# Patient Record
Sex: Male | Born: 1960 | Race: White | Hispanic: No | Marital: Single | Smoking: Never smoker
Health system: Southern US, Community
[De-identification: ages and names within clinical notes are randomized; demographics above are authoritative.]

---

## 1998-11-05 ENCOUNTER — Encounter: Payer: Self-pay | Admitting: Family Medicine

## 1998-11-05 ENCOUNTER — Ambulatory Visit (HOSPITAL_COMMUNITY): Admission: RE | Admit: 1998-11-05 | Discharge: 1998-11-05 | Payer: Self-pay | Admitting: Family Medicine

## 2005-01-10 ENCOUNTER — Ambulatory Visit (HOSPITAL_COMMUNITY): Admission: RE | Admit: 2005-01-10 | Discharge: 2005-01-10 | Payer: Self-pay | Admitting: Family Medicine

## 2013-02-11 ENCOUNTER — Inpatient Hospital Stay: Admit: 2013-02-11 | Payer: Self-pay | Admitting: Orthopedic Surgery

## 2013-02-11 SURGERY — ARTHROPLASTY, KNEE, TOTAL
Anesthesia: Spinal | Site: Knee | Laterality: Right

## 2013-08-06 ENCOUNTER — Other Ambulatory Visit (HOSPITAL_COMMUNITY): Payer: Self-pay | Admitting: Urology

## 2013-08-06 DIAGNOSIS — N4340 Spermatocele of epididymis, unspecified: Secondary | ICD-10-CM

## 2013-08-13 ENCOUNTER — Ambulatory Visit (HOSPITAL_COMMUNITY)
Admission: RE | Admit: 2013-08-13 | Discharge: 2013-08-13 | Disposition: A | Discharge status: Home / Self Care | Payer: BC Managed Care – PPO | Source: Ambulatory Visit | Attending: Urology | Admitting: Urology

## 2013-08-13 DIAGNOSIS — N4340 Spermatocele of epididymis, unspecified: Secondary | ICD-10-CM

## 2013-08-13 DIAGNOSIS — N509 Disorder of male genital organs, unspecified: Secondary | ICD-10-CM | POA: Insufficient documentation

## 2013-08-13 DIAGNOSIS — N508 Other specified disorders of male genital organs: Secondary | ICD-10-CM | POA: Insufficient documentation

## 2016-01-18 ENCOUNTER — Ambulatory Visit: Admit: 2016-01-18 | Payer: BC Managed Care – PPO | Admitting: Orthopedic Surgery

## 2016-01-18 SURGERY — ARTHROPLASTY, KNEE, TOTAL
Anesthesia: Spinal | Site: Knee | Laterality: Right

## 2016-05-20 ENCOUNTER — Other Ambulatory Visit: Payer: Self-pay | Admitting: Physician Assistant

## 2016-05-20 DIAGNOSIS — M79605 Pain in left leg: Secondary | ICD-10-CM

## 2016-05-27 ENCOUNTER — Other Ambulatory Visit: Payer: BC Managed Care – PPO

## 2016-05-27 ENCOUNTER — Ambulatory Visit
Admission: RE | Admit: 2016-05-27 | Discharge: 2016-05-27 | Disposition: A | Discharge status: Home / Self Care | Payer: BC Managed Care – PPO | Source: Ambulatory Visit | Attending: Physician Assistant | Admitting: Physician Assistant

## 2016-05-27 DIAGNOSIS — M79605 Pain in left leg: Secondary | ICD-10-CM

## 2016-05-30 ENCOUNTER — Other Ambulatory Visit: Payer: BC Managed Care – PPO

## 2017-02-22 ENCOUNTER — Ambulatory Visit
Admission: RE | Admit: 2017-02-22 | Discharge: 2017-02-22 | Disposition: A | Discharge status: Home / Self Care | Payer: Managed Care, Other (non HMO) | Source: Ambulatory Visit | Attending: Family Medicine | Admitting: Family Medicine

## 2017-02-22 ENCOUNTER — Other Ambulatory Visit: Payer: Self-pay | Admitting: Family Medicine

## 2017-02-22 DIAGNOSIS — M25512 Pain in left shoulder: Secondary | ICD-10-CM

## 2018-02-24 DIAGNOSIS — M5136 Other intervertebral disc degeneration, lumbar region: Secondary | ICD-10-CM | POA: Diagnosis not present

## 2018-02-27 DIAGNOSIS — F339 Major depressive disorder, recurrent, unspecified: Secondary | ICD-10-CM | POA: Diagnosis not present

## 2018-03-20 DIAGNOSIS — F4321 Adjustment disorder with depressed mood: Secondary | ICD-10-CM | POA: Diagnosis not present

## 2018-03-21 DIAGNOSIS — D2271 Melanocytic nevi of right lower limb, including hip: Secondary | ICD-10-CM | POA: Diagnosis not present

## 2018-03-21 DIAGNOSIS — D2371 Other benign neoplasm of skin of right lower limb, including hip: Secondary | ICD-10-CM | POA: Diagnosis not present

## 2018-03-21 DIAGNOSIS — D225 Melanocytic nevi of trunk: Secondary | ICD-10-CM | POA: Diagnosis not present

## 2018-03-21 DIAGNOSIS — D2372 Other benign neoplasm of skin of left lower limb, including hip: Secondary | ICD-10-CM | POA: Diagnosis not present

## 2018-03-21 DIAGNOSIS — D485 Neoplasm of uncertain behavior of skin: Secondary | ICD-10-CM | POA: Diagnosis not present

## 2018-03-24 DIAGNOSIS — F329 Major depressive disorder, single episode, unspecified: Secondary | ICD-10-CM | POA: Insufficient documentation

## 2018-03-24 DIAGNOSIS — F32A Depression, unspecified: Secondary | ICD-10-CM

## 2018-04-10 ENCOUNTER — Ambulatory Visit (INDEPENDENT_AMBULATORY_CARE_PROVIDER_SITE_OTHER): Payer: BLUE CROSS/BLUE SHIELD | Admitting: Psychiatry

## 2018-04-10 DIAGNOSIS — M25561 Pain in right knee: Secondary | ICD-10-CM | POA: Diagnosis not present

## 2018-04-10 DIAGNOSIS — E7800 Pure hypercholesterolemia, unspecified: Secondary | ICD-10-CM | POA: Diagnosis not present

## 2018-04-10 DIAGNOSIS — Z23 Encounter for immunization: Secondary | ICD-10-CM | POA: Diagnosis not present

## 2018-04-10 DIAGNOSIS — F331 Major depressive disorder, recurrent, moderate: Secondary | ICD-10-CM

## 2018-04-10 DIAGNOSIS — M545 Low back pain: Secondary | ICD-10-CM | POA: Diagnosis not present

## 2018-04-10 DIAGNOSIS — Z0000 Encounter for general adult medical examination without abnormal findings: Secondary | ICD-10-CM | POA: Diagnosis not present

## 2018-04-10 DIAGNOSIS — J302 Other seasonal allergic rhinitis: Secondary | ICD-10-CM | POA: Diagnosis not present

## 2018-04-10 MED ORDER — DULOXETINE HCL 30 MG PO CPEP: 30.0000 mg | ORAL_CAPSULE | Freq: Every day | ORAL | 0 refills | Status: DC

## 2018-04-10 MED ORDER — DULOXETINE HCL 60 MG PO CPEP: 60.0000 mg | ORAL_CAPSULE | Freq: Every day | ORAL | 1 refills | Status: DC

## 2018-04-10 NOTE — Progress Notes (Signed)
[facility]  Patient ID: Arthur Sanchez,  MRN: [id number]  PCP: Elias Else, MD  Date of Evaluation: 04/10/2018 Time spent:20 minutes   HISTORY/CURRENT STATUS: HPI patient is a 57 year old male last seen 02/27/2018 Depression was better with continued the Effexor and continue tapering off the Zoloft consider Cymbalta or gabapentin in the future  Individual Medical History/ Review of Systems: Changes? :No  Allergies: Patient has no known allergies.  Current Medications:  Current Outpatient Medications:  .  DULoxetine (CYMBALTA) 30 MG capsule, Take 1 capsule (30 mg total) by mouth daily for 7 days., Disp: 7 capsule, Rfl: 0 .  [START ON 04/17/2018] DULoxetine (CYMBALTA) 60 MG capsule, Take 1 capsule (60 mg total) by mouth daily., Disp: 60 capsule, Rfl: 1 .  venlafaxine XR (EFFEXOR-XR) 75 MG 24 hr capsule, Take 75 mg by mouth daily with breakfast. Discontinue in 1 week, Disp: , Rfl:  Medication Side Effects: Other: diarrhea  Family Medical/ Social History: Changes? Yes  Following up on skin lesion   MENTAL HEALTH EXAM:  There were no vitals taken for this visit.There is no height or weight on file to calculate BMI.  General Appearance: Casual  Eye Contact:  Good  Speech:  Normal Rate  Volume:  Normal  Mood:  Euphoric  Affect:  Appropriate  Thought Process:  Linear  Orientation:  Full (Time, Place, and Person)  Thought Content: Logical   Suicidal Thoughts:  No  Homicidal Thoughts:  No  Memory:  Immediate  Judgement:  Good  Insight:  Good  Psychomotor Activity:  Normal  Concentration:  Concentration: Good  Recall:  Good  Fund of Knowledge: Good  Language: Good  Akathisia:  NA  AIMS (if indicated): not done  Assets:  Desire for Improvement  ADL's:  Intact  Cognition: WNL  Prognosis:  Good    DIAGNOSES:    ICD-10-CM   1. Major depressive disorder, recurrent episode, moderate (HCC) F33.1     RECOMMENDATIONS: stop effexor start Cymbalta 30 mg a day for  a week and then 60 mg a day.    Anne Fu, PA-C

## 2018-04-24 DIAGNOSIS — D485 Neoplasm of uncertain behavior of skin: Secondary | ICD-10-CM | POA: Diagnosis not present

## 2018-04-24 DIAGNOSIS — L7682 Other postprocedural complications of skin and subcutaneous tissue: Secondary | ICD-10-CM | POA: Diagnosis not present

## 2018-05-01 DIAGNOSIS — F4321 Adjustment disorder with depressed mood: Secondary | ICD-10-CM | POA: Diagnosis not present

## 2018-05-06 ENCOUNTER — Encounter: Payer: Self-pay | Admitting: Emergency Medicine

## 2018-05-14 ENCOUNTER — Ambulatory Visit (INDEPENDENT_AMBULATORY_CARE_PROVIDER_SITE_OTHER): Payer: BLUE CROSS/BLUE SHIELD | Admitting: Psychiatry

## 2018-05-14 DIAGNOSIS — F324 Major depressive disorder, single episode, in partial remission: Secondary | ICD-10-CM

## 2018-05-14 MED ORDER — DULOXETINE HCL 60 MG PO CPEP: 60.0000 mg | ORAL_CAPSULE | Freq: Every day | ORAL | 1 refills | Status: DC

## 2018-05-14 NOTE — Progress Notes (Signed)
[facility]  Patient ID: Arthur Sanchez,  MRN: [id number]  PCP: Elias Else, MD  Date of Evaluation: 05/14/2018 Time spent:20 minutes  Chief Complaint:   HISTORY/CURRENT STATUS: HPI 57 year old white male last seen 02/27/2018.  His depression was better.  We switched him from Effexor to Cymbalta as he was also having joint pain.  Currently he has a few hours of depression every several weeks.    Individual Medical History/ Review of Systems: Changes? :No   Allergies: Patient has no known allergies.  Current Medications:  Current Outpatient Medications:  .  DULoxetine (CYMBALTA) 60 MG capsule, Take 1 capsule (60 mg total) by mouth daily., Disp: 30 capsule, Rfl: 1 .  sertraline (ZOLOFT) 100 MG tablet, Take 100 mg by mouth daily., Disp: , Rfl:  .  venlafaxine XR (EFFEXOR-XR) 75 MG 24 hr capsule, Take 75 mg by mouth daily with breakfast. Discontinue in 1 week, Disp: , Rfl:  Medication Side Effects: none  Family Medical/ Social History: Changes? No  MENTAL HEALTH EXAM:  There were no vitals taken for this visit.There is no height or weight on file to calculate BMI.  General Appearance: Casual  Eye Contact:  Good  Speech:  Normal Rate  Volume:  Normal  Mood:  Euthymic  Affect:  Appropriate  Thought Process:  Goal Directed  Orientation:  Full (Time, Place, and Person)  Thought Content: Logical   Suicidal Thoughts:  No  Homicidal Thoughts:  No  Memory:  WNL  Judgement:  Good  Insight:  Good  Psychomotor Activity:  Normal  Concentration:  Concentration: Good  Recall:  Good  Fund of Knowledge: Good  Language: Good  Assets:  Desire for Improvement  ADL's:  Intact  Cognition: WNL  Prognosis:  Good    DIAGNOSES:    ICD-10-CM   1. Major depressive disorder with single episode, in partial remission (HCC) F32.4     Receiving Psychotherapy: No    RECOMMENDATIONS: We will make no changes patient is to stay on Cymbalta 60 mg a day and I will see him again  in 6 weeks   Anne Fu, PA-C

## 2018-06-26 ENCOUNTER — Ambulatory Visit: Payer: BLUE CROSS/BLUE SHIELD | Admitting: Psychiatry

## 2018-07-06 ENCOUNTER — Other Ambulatory Visit: Payer: Self-pay

## 2018-07-06 MED ORDER — DULOXETINE HCL 60 MG PO CPEP: 60.0000 mg | ORAL_CAPSULE | Freq: Every day | ORAL | 0 refills | Status: DC

## 2018-07-16 ENCOUNTER — Ambulatory Visit: Payer: BLUE CROSS/BLUE SHIELD | Admitting: Psychiatry

## 2018-07-16 DIAGNOSIS — F324 Major depressive disorder, single episode, in partial remission: Secondary | ICD-10-CM

## 2018-07-16 DIAGNOSIS — F411 Generalized anxiety disorder: Secondary | ICD-10-CM | POA: Diagnosis not present

## 2018-07-16 MED ORDER — DULOXETINE HCL 60 MG PO CPEP: 60.0000 mg | ORAL_CAPSULE | Freq: Every day | ORAL | 0 refills | Status: DC

## 2018-07-16 NOTE — Progress Notes (Signed)
[facility]  Patient ID: Arthur Sanchez,  MRN: [id number]  PCP: Elias Else, MD  Date of Evaluation: 07/16/2018 Time spent:20 minutes  Chief Complaint:   HISTORY/CURRENT STATUS: HPI patient was seen 05/14/2018.  Doing well no medication is made. Patient continues to improve.   Individual Medical History/ Review of Systems: Changes? :No   Allergies: Patient has no known allergies.  Current Medications:  Current Outpatient Medications:  .  DULoxetine (CYMBALTA) 60 MG capsule, Take 1 capsule (60 mg total) by mouth daily., Disp: 90 capsule, Rfl: 0 .  sertraline (ZOLOFT) 100 MG tablet, Take 100 mg by mouth daily., Disp: , Rfl:  .  venlafaxine XR (EFFEXOR-XR) 75 MG 24 hr capsule, Take 75 mg by mouth daily with breakfast. Discontinue in 1 week, Disp: , Rfl:  Medication Side Effects: none  Family Medical/ Social History: Changes? no  MENTAL HEALTH EXAM:  There were no vitals taken for this visit.There is no height or weight on file to calculate BMI.  General Appearance: Casual  Eye Contact:  Good  Speech:  Normal Rate  Volume:  Normal  Mood:  Euthymic  Affect:  Appropriate  Thought Process:  Linear  Orientation:  Full (Time, Place, and Person)  Thought Content: Logical   Suicidal Thoughts:  No  Homicidal Thoughts:  No  Memory:  WNL  Judgement:  Good  Insight:  Good  Psychomotor Activity:  Normal  Concentration:  Concentration: Good  Recall:  Good  Fund of Knowledge: Good  Language: Good  Assets:  Desire for Improvement  ADL's:  Intact  Cognition: WNL  Prognosis:  Good    DIAGNOSES:    ICD-10-CM   1. Major depressive disorder with single episode, in partial remission (HCC) F32.4   2. Anxiety state F41.1     Receiving Psychotherapy: No    RECOMMENDATIONS: Patient is to continue Cymbalta 60 mg a day. Return in 3 months.   Anne Fu, PA-C

## 2018-10-15 ENCOUNTER — Ambulatory Visit: Payer: BLUE CROSS/BLUE SHIELD | Admitting: Psychiatry

## 2018-12-18 ENCOUNTER — Encounter: Payer: Self-pay | Admitting: Psychiatry

## 2018-12-18 ENCOUNTER — Ambulatory Visit (INDEPENDENT_AMBULATORY_CARE_PROVIDER_SITE_OTHER): Payer: 59 | Admitting: Psychiatry

## 2018-12-18 ENCOUNTER — Other Ambulatory Visit: Payer: Self-pay

## 2018-12-18 DIAGNOSIS — F3342 Major depressive disorder, recurrent, in full remission: Secondary | ICD-10-CM | POA: Diagnosis not present

## 2018-12-18 MED ORDER — DULOXETINE HCL 20 MG PO CPEP: 40.0000 mg | ORAL_CAPSULE | Freq: Every day | ORAL | 0 refills | Status: DC

## 2018-12-18 NOTE — Progress Notes (Signed)
Arthur LarsenJohn Sanchez [id number] 11/05/1960 58 y.o.  Subjective:   Patient ID:  Arthur Sanchez is a 58 y.o. (DOB 01/30/1961) male.  Chief Complaint:  Chief Complaint  Patient presents with  . Follow-up    Medication Management  . Depression    Medication Management    HPI Arthur LarsenJohn Bagsby presents to the office today for follow-up of depression and anxiety.  Last seen July 16, 2018.  He was doing well on Effexor XR 75 mg daily.  No meds were changed  Continue to be good except has had diarrhea with each of the antidepressants but least with this.  Still having problems, pretty manageable.  Occ takes Immodium.  Never had this problem until antidepressants.  He's talked with wife about his mood and he thinks he's doing pretty well.  Overall life stress is better now than it is now.  Better professionally and financially.  Less irritable with the kids now.  Anxiety.  Patient reports stable mood and denies depressed or irritable moods.  Patient denies any recent difficulty with anxiety.  Patient denies difficulty with sleep initiation or maintenance. Denies appetite disturbance.  Patient reports that energy and motivation have been good.  Patient denies any difficulty with concentration.  Patient denies any suicidal ideation.  Exercises.  Past Psychiatric Medication Trials: Sertraline, venlafaxine, duloxetine  Review of Systems:  Review of Systems  Musculoskeletal: Positive for arthralgias.  Neurological: Negative for tremors and weakness.    Medications: I have reviewed the patient's current medications.  Current Outpatient Medications  Medication Sig Dispense Refill  . aspirin (ASPIR-LOW) 81 MG EC tablet Take 81 mg by mouth daily. Swallow whole.    . DULoxetine (CYMBALTA) 60 MG capsule Take 1 capsule (60 mg total) by mouth daily. 90 capsule 0  . Loratadine (CLARITIN) 10 MG CAPS Take by mouth.    . Multiple Vitamins-Minerals (MENS MULTIVITAMIN PO) Take by mouth.    . Omega 3 1000 MG CAPS  Take by mouth.    . pravastatin (PRAVACHOL) 40 MG tablet 1 TABLET AT BEDTIME BY MOUTH 90 DAYS    . sildenafil (VIAGRA) 50 MG tablet Take 50 mg by mouth as needed for erectile dysfunction.    . TURMERIC PO Take 1,000 mg by mouth daily.    . valACYclovir (VALTREX) 500 MG tablet Take 500 mg by mouth daily.     No current facility-administered medications for this visit.     Medication Side Effects: Other: diarrhea  Allergies:  Allergies  Allergen Reactions  . Dust Mite Mixed Allergen Ext  [Mite (D. Farinae)] Other (See Comments)  . Molds & Smuts Cough    History reviewed. No pertinent past medical history.  Family History  Problem Relation Age of Onset  . Bipolar disorder Sister   . Alcohol abuse Sister     Social History   Socioeconomic History  . Marital status: Single    Spouse name: Not on file  . Number of children: Not on file  . Years of education: Not on file  . Highest education level: Not on file  Occupational History  . Not on file  Social Needs  . Financial resource strain: Not on file  . Food insecurity:    Worry: Not on file    Inability: Not on file  . Transportation needs:    Medical: Not on file    Non-medical: Not on file  Tobacco Use  . Smoking status: Never Smoker  . Smokeless tobacco: Never Used  Substance and Sexual Activity  .  Alcohol use: Not on file  . Drug use: Not on file  . Sexual activity: Not on file  Lifestyle  . Physical activity:    Days per week: Not on file    Minutes per session: Not on file  . Stress: Not on file  Relationships  . Social connections:    Talks on phone: Not on file    Gets together: Not on file    Attends religious service: Not on file    Active member of club or organization: Not on file    Attends meetings of clubs or organizations: Not on file    Relationship status: Not on file  . Intimate partner violence:    Fear of current or ex partner: Not on file    Emotionally abused: Not on file     Physically abused: Not on file    Forced sexual activity: Not on file  Other Topics Concern  . Not on file  Social History Narrative  . Not on file    Past Medical History, Surgical history, Social history, and Family history were reviewed and updated as appropriate.   Please see review of systems for further details on the patient's review from today.   Objective:   Physical Exam:  There were no vitals taken for this visit.  Physical Exam Constitutional:      General: He is not in acute distress.    Appearance: He is well-developed.  Musculoskeletal:        General: No deformity.  Neurological:     Mental Status: He is alert and oriented to person, place, and time.     Coordination: Coordination normal.  Psychiatric:        Attention and Perception: Attention normal. He is attentive.        Mood and Affect: Mood normal. Mood is not anxious or depressed. Affect is not labile, blunt, angry or inappropriate.        Speech: Speech normal.        Behavior: Behavior normal.        Thought Content: Thought content normal. Thought content does not include homicidal or suicidal ideation. Thought content does not include homicidal or suicidal plan.        Cognition and Memory: Cognition normal.        Judgment: Judgment normal.     Comments: Insight is good.     Lab Review:  No results found for: NA, K, CL, CO2, GLUCOSE, BUN, CREATININE, CALCIUM, PROT, ALBUMIN, AST, ALT, ALKPHOS, BILITOT, GFRNONAA, GFRAA  No results found for: WBC, RBC, HGB, HCT, PLT, MCV, MCH, MCHC, RDW, LYMPHSABS, MONOABS, EOSABS, BASOSABS  No results found for: POCLITH, LITHIUM   No results found for: PHENYTOIN, PHENOBARB, VALPROATE, CBMZ   .res Assessment: Plan:    Major depression, recurrent, full remission (HCC)  Option taper bc stable with first episode.  I have his depression.  At the time of his original episode he was under a lot of great deal of stress which is no longer the case.  No prior  history of significant depression nor treatment for depression.  He has been stable for several months.  He is having some diarrhea with the Cymbalta.  He wonders if it is okay to taper.  Discussed relapse risk and signs and symptoms of depression.  Is quite reasonable to taper the medication.  He is has a low risk of recurrence given his age and the circumstances.  Discussed SSRI withdrawal.  He will decreased  from 60 mg to 40 mg daily for 1 month then 20 mg daily for 1 month and then DC.  Call us if he have any problems.  Follow-up PRN  Hiram Comber, MD, DFAPA   Please see After Visit Summary for patient specific instructions.  No future appointments.  No orders of the defined types were placed in this encounter.     -------------------------------

## 2019-03-12 ENCOUNTER — Other Ambulatory Visit: Payer: Self-pay | Admitting: Psychiatry

## 2019-03-12 DIAGNOSIS — F3342 Major depressive disorder, recurrent, in full remission: Secondary | ICD-10-CM

## 2019-03-19 ENCOUNTER — Other Ambulatory Visit: Payer: Self-pay

## 2019-03-19 DIAGNOSIS — F3342 Major depressive disorder, recurrent, in full remission: Secondary | ICD-10-CM

## 2019-03-19 MED ORDER — DULOXETINE HCL 20 MG PO CPEP: 40.0000 mg | ORAL_CAPSULE | Freq: Every day | ORAL | 0 refills | Status: DC

## 2020-03-04 ENCOUNTER — Encounter: Payer: Self-pay | Admitting: General Practice

## 2020-03-05 ENCOUNTER — Encounter: Payer: Self-pay | Admitting: Family Medicine

## 2020-04-12 NOTE — Progress Notes (Signed)
Cardiology Office Note:    Date:  04/13/2020   ID:  Arthur Sanchez, DOB [birth year], MRN [id number]  PCP:  Maury Dus, MD  Cardiologist:  No primary care provider on file.  Electrophysiologist:  None   Referring MD: Carol Ada, MD   Chief Complaint  Patient presents with  . Chest Pain    History of Present Illness:    Arthur Sanchez is a 59 y.o. male with a hx of hyperlipidemia who is referred by Dr. Tamala Julian for evaluation of chest pain.  He reports that he has had 2 episodes of chest pain.  First episode was 6 weeks ago.  Describes right-sided cramping pain that woke him from sleep.  Reports was 8 out of 10 in intensity.  Lasted for about an hour to an hour and a half.  Reports that about a week later he then had a second episode of what he described as tightness in the right side of his chest.  States that he was sitting at his desk when it occurred.  Lasted for few hours.  States that he exercises 3-4 times per week.  Walks on the treadmill for 15 minutes and does weights.  Denies any exertional chest pain.  Does report he is been having some dyspnea with exertion.  Also reports that he has had chronic issue with ankle swelling, for which he has seen vascular and was told he had venous insufficiency.  He denies any lightheadedness, syncope, or palpitations (reports he had an episode of palpitations years ago for which he wore a monitor and was told there were no abnormalities).  No smoking history.  No history of heart disease in his immediate family.  No past medical history on file.  No past surgical history on file.  Current Medications: Current Meds  Medication Sig  . aspirin (ASPIR-LOW) 81 MG EC tablet Take 81 mg by mouth daily. Swallow whole.  . Loratadine (CLARITIN) 10 MG CAPS Take by mouth.  . Multiple Vitamins-Minerals (MENS MULTIVITAMIN PO) Take by mouth.  . Omega 3 1000 MG CAPS Take by mouth.  . pravastatin (PRAVACHOL) 40 MG tablet 1 TABLET AT BEDTIME BY MOUTH 90 DAYS   . sildenafil (VIAGRA) 50 MG tablet Take 50 mg by mouth as needed for erectile dysfunction.  . TURMERIC PO Take 1,000 mg by mouth daily.  . valACYclovir (VALTREX) 500 MG tablet Take 500 mg by mouth daily.  . [DISCONTINUED] DULoxetine (CYMBALTA) 20 MG capsule Take 2 capsules (40 mg total) by mouth daily.     Allergies:   Dust mite mixed allergen ext  [mite (d. farinae)] and Molds & smuts   Social History   Socioeconomic History  . Marital status: Single    Spouse name: Not on file  . Number of children: Not on file  . Years of education: Not on file  . Highest education level: Not on file  Occupational History  . Not on file  Tobacco Use  . Smoking status: Never Smoker  . Smokeless tobacco: Never Used  Substance and Sexual Activity  . Alcohol use: Not on file  . Drug use: Not on file  . Sexual activity: Not on file  Other Topics Concern  . Not on file  Social History Narrative  . Not on file   Social Determinants of Health   Financial Resource Strain:   . Difficulty of Paying Living Expenses: Not on file  Food Insecurity:   . Worried About Charity fundraiser in the Last Year:  Not on file  . Ran Out of Food in the Last Year: Not on file  Transportation Needs:   . Lack of Transportation (Medical): Not on file  . Lack of Transportation (Non-Medical): Not on file  Physical Activity:   . Days of Exercise per Week: Not on file  . Minutes of Exercise per Session: Not on file  Stress:   . Feeling of Stress : Not on file  Social Connections:   . Frequency of Communication with Friends and Family: Not on file  . Frequency of Social Gatherings with Friends and Family: Not on file  . Attends Religious Services: Not on file  . Active Member of Clubs or Organizations: Not on file  . Attends Archivist Meetings: Not on file  . Marital Status: Not on file     Family History: The patient's family history includes Alcohol abuse in his sister; Bipolar disorder in his  sister.  ROS:   Please see the history of present illness.     All other systems reviewed and are negative.  EKGs/Labs/Other Studies Reviewed:    The following studies were reviewed today:   EKG:  EKG is ordered today.  The ekg ordered today demonstrates normal sinus rhythm, rate 56, no ST/T abnormalities  Recent Labs: 04/13/2020: ALT 28; BUN 15; Creatinine, Ser 0.99; Potassium 4.8; Sodium 138  Recent Lipid Panel No results found for: CHOL, TRIG, HDL, CHOLHDL, VLDL, LDLCALC, LDLDIRECT  Physical Exam:    VS:  BP 115/71   Pulse (!) 56   Temp (!) 97 F (36.1 C)   Ht _0  (1.93 m)   Wt 253 lb (114.8 kg)   SpO2 96%   BMI 30.80 kg/m     Wt Readings from Last 3 Encounters:  04/13/20 253 lb (114.8 kg)     GEN:  Well nourished, well developed in no acute distress HEENT: Normal NECK: No JVD; No carotid bruits LYMPHATICS: No lymphadenopathy CARDIAC: RRR, no murmurs, rubs, gallops RESPIRATORY:  Clear to auscultation without rales, wheezing or rhonchi  ABDOMEN: Soft, non-tender, non-distended MUSCULOSKELETAL:  No edema; No deformity  SKIN: Warm and dry NEUROLOGIC:  Alert and oriented x 3 PSYCHIATRIC:  Normal affect   ASSESSMENT:    1. Chest pain of uncertain etiology   2. Hyperlipidemia, unspecified hyperlipidemia type    PLAN:    Chest pain: Atypical in description, but does have CAD risk factors (age, hyperlipidemia).  Overall would classify as intermediate risk of obstructive coronary disease and warrants further evaluation -Coronary CTA.  Will give metoprolol 25 mg prior to exam given low resting heart rate -Echocardiogram  Hyperlipidemia: LDL 119 on 08/12/2019.  On pravastatin 40 mg daily.  Will follow up results of coronary CTA, will need more aggressive cholesterol control if coronary plaque seen  RTC in 3 months  Medication Adjustments/Labs and Tests Ordered: Current medicines are reviewed at length with the patient today.  Concerns regarding medicines are  outlined above.  Orders Placed This Encounter  Procedures  . CT CORONARY MORPH W/CTA COR W/SCORE W/CA W/CM &/OR WO/CM  . CT CORONARY FRACTIONAL FLOW RESERVE DATA PREP  . CT CORONARY FRACTIONAL FLOW RESERVE FLUID ANALYSIS  . Comprehensive metabolic panel  . EKG 12-Lead  . ECHOCARDIOGRAM COMPLETE   Meds ordered this encounter  Medications  . metoprolol tartrate (LOPRESSOR) 25 MG tablet    Sig: Take 25 mg (1 tablet) TWO hours prior to CT    Dispense:  1 tablet    Refill:  0    Patient Instructions  Medication Instructions:  Your physician recommends that you continue on your current medications as directed. Please refer to the Current Medication list given to you today.  The morning of the coronary CT scan-take metoprolol tartrate (Lopressor) 25 mg TWO hours prior to scan  *If you need a refill on your cardiac medications before your next appointment, please call your pharmacy*  Labs:  CMET today  Testing/Procedures: Your physician has requested that you have an echocardiogram. Echocardiography is a painless test that uses sound waves to create images of your heart. It provides your doctor with information about the size and shape of your heart and how well your heart's chambers and valves are working. This procedure takes approximately one hour. There are no restrictions for this procedure.  This will be done at our [facility] location:  [address]  Coronary CTA- see instruction sheet below  Follow-Up: At [facility], you and your health needs are our priority.  As part of our continuing mission to provide you with exceptional heart care, we have created designated Provider Care Teams.  These Care Teams include your primary Cardiologist (physician) and Advanced Practice Providers (APPs -  Physician Assistants and Nurse Practitioners) who all work together to provide you with the care you need, when you need it.  We recommend signing up for the patient  portal called "MyChart".  Sign up information is provided on this After Visit Summary.  MyChart is used to connect with patients for Virtual Visits (Telemedicine).  Patients are able to view lab/test results, encounter notes, upcoming appointments, etc.  Non-urgent messages can be sent to your provider as well.   To learn more about what you can do with MyChart, go to [email or web address].    Your next appointment:   3 month(s)  The format for your next appointment:   In Person  Provider:   Oswaldo Milian, MD   Other Instructions   Your cardiac CT will be scheduled at one of the below locations:   [facility] [address] [place], [place] [address] [phone]  [place] [address] [place], [place] [address] [phone]  If scheduled at [facility], please arrive at the [facility] main entrance of [facility] 30 minutes prior to test start time. Proceed to the [facility] Radiology Department (first floor) to check-in and test prep.  If scheduled at [facility], please arrive 15 mins early for check-in and test prep.  Please follow these instructions carefully (unless otherwise directed):  Hold all erectile dysfunction medications at least 3 days (72 hrs) prior to test.  On the Night Before the Test: . Be sure to Drink plenty of water. . Do not consume any caffeinated/decaffeinated beverages or chocolate 12 hours prior to your test. . Do not take any antihistamines 12 hours prior to your test.  On the Day of the Test: . Drink plenty of water. Do not drink any water within one hour of the test. . Do not eat any food 4 hours prior to the test. . You may take your regular medications prior to the test.  . Take metoprolol (Lopressor) two hours prior to test.      After the Test: . Drink plenty of water. . After receiving IV contrast, you may  experience a mild flushed feeling. This is normal. . On occasion, you may experience a  mild rash up to 24 hours after the test. This is not dangerous. If this occurs, you can take Benadryl 25 mg and increase your fluid intake. . If you experience trouble breathing, this can be serious. If it is severe call 911 IMMEDIATELY. If it is mild, please call our office. . If you take any of these medications: Glipizide/Metformin, Avandament, Glucavance, please do not take 48 hours after completing test unless otherwise instructed.   Once we have confirmed authorization from your insurance company, we will call you to set up a date and time for your test. Based on how quickly your insurance processes prior authorizations requests, please allow up to 4 weeks to be contacted for scheduling your Cardiac CT appointment. Be advised that routine Cardiac CT appointments could be scheduled as many as 8 weeks after your provider has ordered it.  For non-scheduling related questions, please contact the cardiac imaging nurse navigator should you have any questions/concerns: Marchia Bond, Cardiac Imaging Nurse Navigator Burley Saver, Interim Cardiac Imaging Nurse [facility] [facility] Direct Office Dial: [phone]   For scheduling needs, including cancellations and rescheduling, please call Vivien Rota at [phone], option 3.        Signed, Donato Heinz, MD  04/13/2020 5:43 PM    [place]

## 2020-04-13 ENCOUNTER — Ambulatory Visit (INDEPENDENT_AMBULATORY_CARE_PROVIDER_SITE_OTHER): Payer: Managed Care, Other (non HMO) | Admitting: Cardiology

## 2020-04-13 ENCOUNTER — Other Ambulatory Visit: Payer: Self-pay

## 2020-04-13 ENCOUNTER — Encounter: Payer: Self-pay | Admitting: Cardiology

## 2020-04-13 VITALS — BP 115/71 | HR 56 | Temp 97.0°F | Ht 76.0 in | Wt 253.0 lb

## 2020-04-13 DIAGNOSIS — E785 Hyperlipidemia, unspecified: Secondary | ICD-10-CM

## 2020-04-13 DIAGNOSIS — R079 Chest pain, unspecified: Secondary | ICD-10-CM

## 2020-04-13 LAB — COMPREHENSIVE METABOLIC PANEL
ALT: 28 IU/L (ref 0–44)
AST: 28 IU/L (ref 0–40)
Albumin/Globulin Ratio: 2.1 (ref 1.2–2.2)
Albumin: 4.6 g/dL (ref 3.8–4.9)
Alkaline Phosphatase: 64 IU/L (ref 44–121)
BUN/Creatinine Ratio: 15 (ref 9–20)
BUN: 15 mg/dL (ref 6–24)
Bilirubin Total: 0.5 mg/dL (ref 0.0–1.2)
CO2: 25 mmol/L (ref 20–29)
Calcium: 9.7 mg/dL (ref 8.7–10.2)
Chloride: 100 mmol/L (ref 96–106)
Creatinine, Ser: 0.99 mg/dL (ref 0.76–1.27)
GFR calc Af Amer: 96 mL/min/{1.73_m2} (ref >59)
GFR calc non Af Amer: 83 mL/min/{1.73_m2} (ref >59)
Globulin, Total: 2.2 g/dL (ref 1.5–4.5)
Glucose: 97 mg/dL (ref 65–99)
Potassium: 4.8 mmol/L (ref 3.5–5.2)
Sodium: 138 mmol/L (ref 134–144)
Total Protein: 6.8 g/dL (ref 6.0–8.5)

## 2020-04-13 MED ORDER — METOPROLOL TARTRATE 25 MG PO TABS: ORAL_TABLET | ORAL | 0 refills | Status: DC

## 2020-04-13 NOTE — Patient Instructions (Signed)
Medication Instructions:  Your physician recommends that you continue on your current medications as directed. Please refer to the Current Medication list given to you today.  The morning of the coronary CT scan-take metoprolol tartrate (Lopressor) 25 mg TWO hours prior to scan  *If you need a refill on your cardiac medications before your next appointment, please call your pharmacy*  Labs:  CMET today  Testing/Procedures: Your physician has requested that you have an echocardiogram. Echocardiography is a painless test that uses sound waves to create images of your heart. It provides your doctor with information about the size and shape of your heart and how well your heart's chambers and valves are working. This procedure takes approximately one hour. There are no restrictions for this procedure.  This will be done at our [facility] location:  [address]  Coronary CTA- see instruction sheet below  Follow-Up: At [facility], you and your health needs are our priority.  As part of our continuing mission to provide you with exceptional heart care, we have created designated Provider Care Teams.  These Care Teams include your primary Cardiologist (physician) and Advanced Practice Providers (APPs -  Physician Assistants and Nurse Practitioners) who all work together to provide you with the care you need, when you need it.  We recommend signing up for the patient portal called "MyChart".  Sign up information is provided on this After Visit Summary.  MyChart is used to connect with patients for Virtual Visits (Telemedicine).  Patients are able to view lab/test results, encounter notes, upcoming appointments, etc.  Non-urgent messages can be sent to your provider as well.   To learn more about what you can do with MyChart, go to [email or web address].    Your next appointment:   3 month(s)  The format for your next appointment:   In Person  Provider:   Oswaldo Milian, MD   Other Instructions   Your cardiac CT will be scheduled at one of the below locations:   [facility] [address] [place], [place] [address] [phone]  [facility] [address] [place], [place] [address] [phone]  If scheduled at [facility], please arrive at the [facility] main entrance of [facility] 30 minutes prior to test start time. Proceed to the [facility] Radiology Department (first floor) to check-in and test prep.  If scheduled at [facility], please arrive 15 mins early for check-in and test prep.  Please follow these instructions carefully (unless otherwise directed):  Hold all erectile dysfunction medications at least 3 days (72 hrs) prior to test.  On the Night Before the Test: . Be sure to Drink plenty of water. . Do not consume any caffeinated/decaffeinated beverages or chocolate 12 hours prior to your test. . Do not take any antihistamines 12 hours prior to your test.  On the Day of the Test: . Drink plenty of water. Do not drink any water within one hour of the test. . Do not eat any food 4 hours prior to the test. . You may take your regular medications prior to the test.  . Take metoprolol (Lopressor) two hours prior to test.      After the Test: . Drink plenty of water. . After receiving IV contrast, you may experience a mild flushed feeling. This is normal. . On occasion, you may experience a mild rash up to 24 hours after the  test. This is not dangerous. If this occurs, you can take Benadryl 25 mg and increase your fluid intake. . If you experience trouble breathing, this can be serious. If it is severe call 911 IMMEDIATELY. If it is mild, please call our office. . If you take any of these medications: Glipizide/Metformin, Avandament, Glucavance, please do not take 48 hours after completing test unless otherwise  instructed.   Once we have confirmed authorization from your insurance company, we will call you to set up a date and time for your test. Based on how quickly your insurance processes prior authorizations requests, please allow up to 4 weeks to be contacted for scheduling your Cardiac CT appointment. Be advised that routine Cardiac CT appointments could be scheduled as many as 8 weeks after your provider has ordered it.  For non-scheduling related questions, please contact the cardiac imaging nurse navigator should you have any questions/concerns: Marchia Bond, Cardiac Imaging Nurse Navigator Burley Saver, Interim Cardiac Imaging Nurse [facility] Direct Office Dial: [phone]   For scheduling needs, including cancellations and rescheduling, please call Vivien Rota at [phone], option 3.

## 2020-04-30 ENCOUNTER — Ambulatory Visit (HOSPITAL_COMMUNITY): Payer: Managed Care, Other (non HMO) | Attending: Cardiology

## 2020-04-30 ENCOUNTER — Other Ambulatory Visit: Payer: Self-pay

## 2020-04-30 DIAGNOSIS — R079 Chest pain, unspecified: Secondary | ICD-10-CM | POA: Diagnosis present

## 2020-04-30 LAB — ECHOCARDIOGRAM COMPLETE
Area-P 1/2: 3.48 cm2
S' Lateral: 3.3 cm

## 2020-05-06 ENCOUNTER — Telehealth (HOSPITAL_COMMUNITY): Payer: Self-pay | Admitting: Emergency Medicine

## 2020-05-06 NOTE — Telephone Encounter (Signed)
Attempted to call patient regarding upcoming cardiac CT appointment. °Left message on voicemail with name and callback number °Syriana Croslin RN Navigator Cardiac Imaging °[facility] °[phone] Office °[phone] Cell ° °

## 2020-05-06 NOTE — Telephone Encounter (Signed)
Reaching out to patient to offer assistance regarding upcoming cardiac imaging study; pt verbalizes understanding of appt date/time, parking situation and where to check in, pre-test NPO status and medications ordered, and verified current allergies; name and call back number provided for further questions should they arise Rockwell Alexandria RN Navigator Cardiac Imaging [facility] [phone] office [phone] cell  Pt verbalized understanding to avoid use of viagra. Will take 25mg  metoprolol 2 hr prior to scan. Denies further questions. 

## 2020-05-07 ENCOUNTER — Encounter: Payer: Self-pay | Admitting: *Deleted

## 2020-05-07 ENCOUNTER — Other Ambulatory Visit: Payer: Self-pay

## 2020-05-07 ENCOUNTER — Ambulatory Visit (HOSPITAL_COMMUNITY)
Admission: RE | Admit: 2020-05-07 | Discharge: 2020-05-07 | Disposition: A | Discharge status: Home / Self Care | Payer: Managed Care, Other (non HMO) | Source: Ambulatory Visit | Attending: Cardiology | Admitting: Cardiology

## 2020-05-07 DIAGNOSIS — R079 Chest pain, unspecified: Secondary | ICD-10-CM

## 2020-05-07 DIAGNOSIS — Z006 Encounter for examination for normal comparison and control in clinical research program: Secondary | ICD-10-CM

## 2020-05-07 MED ORDER — IOHEXOL 350 MG/ML SOLN
80.0000 mL | Freq: Once | INTRAVENOUS | Status: AC | PRN
  Administered 2020-05-07: 80 mL via INTRAVENOUS

## 2020-05-07 MED ORDER — NITROGLYCERIN 0.4 MG SL SUBL
SUBLINGUAL_TABLET | SUBLINGUAL | Status: AC
  Filled 2020-05-07: qty 2

## 2020-05-07 MED ORDER — NITROGLYCERIN 0.4 MG SL SUBL
0.8000 mg | SUBLINGUAL_TABLET | Freq: Once | SUBLINGUAL | Status: AC
  Administered 2020-05-07: 0.8 mg via SUBLINGUAL

## 2020-05-07 NOTE — Research (Signed)
CADFEM Informed Consent                  Subject Name:   Arthur Sanchez   Subject met inclusion and exclusion criteria.  The informed consent form, study requirements and expectations were reviewed with the subject and questions and concerns were addressed prior to the signing of the consent form.  The subject verbalized understanding of the trial requirements.  The subject agreed to participate in the CADFEM trial and signed the informed consent.  The informed consent was obtained prior to performance of any protocol-specific procedures for the subject.  A copy of the signed informed consent was given to the subject and a copy was placed in the subject's medical record.   [place] Celeste Tavenner, Research Assistant  05/07/2020 14:30 p.m.

## 2020-05-08 ENCOUNTER — Other Ambulatory Visit: Payer: Self-pay | Admitting: *Deleted

## 2020-05-08 DIAGNOSIS — IMO0001 Reserved for inherently not codable concepts without codable children: Secondary | ICD-10-CM

## 2020-05-08 DIAGNOSIS — R911 Solitary pulmonary nodule: Secondary | ICD-10-CM

## 2020-07-12 NOTE — Progress Notes (Deleted)
Cardiology Office Note:    Date:  07/12/2020   ID:  Arthur Sanchez, DOB 05/23/1961, MRN [id number]  PCP:  Elias Else, MD  Cardiologist:  No primary care provider on file.  Electrophysiologist:  None   Referring MD: Elias Else, MD   No chief complaint on file.   History of Present Illness:    Arthur Sanchez is a 60 y.o. male with a hx of hyperlipidemia who presents for follow-up.  He was referred by Dr. Katrinka Blazing for evaluation of chest pain, initially seen on 04/13/2020.  He reports that he has had 2 episodes of chest pain.  First episode was 6 weeks ago.  Describes right-sided cramping pain that woke him from sleep.  Reports was 8 out of 10 in intensity.  Lasted for about an hour to an hour and a half.  Reports that about a week later he then had a second episode of what he described as tightness in the right side of his chest.  States that he was sitting at his desk when it occurred.  Lasted for few hours.  States that he exercises 3-4 times per week.  Walks on the treadmill for 15 minutes and does weights.  Denies any exertional chest pain.  Does report he is been having some dyspnea with exertion.  Also reports that he has had chronic issue with ankle swelling, for which he has seen vascular and was told he had venous insufficiency.  He denies any lightheadedness, syncope, or palpitations (reports he had an episode of palpitations years ago for which he wore a monitor and was told there were no abnormalities).  No smoking history.  No history of heart disease in his immediate family.  Echocardiogram on 04/30/2020 showed normal biventricular function, no significant valvular disease.  Coronary CTA on 05/07/2020 showed normal coronary arteries, calcium score 0 (though inferior portion of the heart was not imaged to distal RPDA was not seen).  No past medical history on file.  No past surgical history on file.  Current Medications: No outpatient medications have been marked as taking for the  07/15/20 encounter (Appointment) with Little Ishikawa, MD.     Allergies:   Dust mite mixed allergen ext  [mite (d. farinae)] and Molds & smuts   Social History   Socioeconomic History  . Marital status: Single    Spouse name: Not on file  . Number of children: Not on file  . Years of education: Not on file  . Highest education level: Not on file  Occupational History  . Not on file  Tobacco Use  . Smoking status: Never Smoker  . Smokeless tobacco: Never Used  Substance and Sexual Activity  . Alcohol use: Not on file  . Drug use: Not on file  . Sexual activity: Not on file  Other Topics Concern  . Not on file  Social History Narrative  . Not on file   Social Determinants of Health   Financial Resource Strain: Not on file  Food Insecurity: Not on file  Transportation Needs: Not on file  Physical Activity: Not on file  Stress: Not on file  Social Connections: Not on file     Family History: The patient's family history includes Alcohol abuse in his sister; Bipolar disorder in his sister.  ROS:   Please see the history of present illness.     All other systems reviewed and are negative.  EKGs/Labs/Other Studies Reviewed:    The following studies were reviewed today:  EKG:  EKG is ordered today.  The ekg ordered today demonstrates normal sinus rhythm, rate 56, no ST/T abnormalities  Recent Labs: 04/13/2020: ALT 28; BUN 15; Creatinine, Ser 0.99; Potassium 4.8; Sodium 138  Recent Lipid Panel No results found for: CHOL, TRIG, HDL, CHOLHDL, VLDL, LDLCALC, LDLDIRECT  Physical Exam:    VS:  There were no vitals taken for this visit.    Wt Readings from Last 3 Encounters:  04/13/20 253 lb (114.8 kg)     GEN:  Well nourished, well developed in no acute distress HEENT: Normal NECK: No JVD; No carotid bruits LYMPHATICS: No lymphadenopathy CARDIAC: RRR, no murmurs, rubs, gallops RESPIRATORY:  Clear to auscultation without rales, wheezing or rhonchi   ABDOMEN: Soft, non-tender, non-distended MUSCULOSKELETAL:  No edema; No deformity  SKIN: Warm and dry NEUROLOGIC:  Alert and oriented x 3 PSYCHIATRIC:  Normal affect   ASSESSMENT:    No diagnosis found. PLAN:    Chest pain: Atypical in description, but does have CAD risk factors (age, hyperlipidemia).   Echocardiogram on 04/30/2020 showed normal biventricular function, no significant valvular disease.  Coronary CTA on 05/07/2020 showed normal coronary arteries, calcium score 0 (though inferior portion of the heart was not imaged to distal RPDA was not seen).  Hyperlipidemia: LDL 119 on 08/12/2019.  On pravastatin 40 mg daily.    RTC in ***  Medication Adjustments/Labs and Tests Ordered: Current medicines are reviewed at length with the patient today.  Concerns regarding medicines are outlined above.  No orders of the defined types were placed in this encounter.  No orders of the defined types were placed in this encounter.   There are no Patient Instructions on file for this visit.   Signed, Little Ishikawa, MD  07/12/2020 9:05 PM    [facility]

## 2020-07-15 ENCOUNTER — Ambulatory Visit: Payer: Managed Care, Other (non HMO) | Admitting: Cardiology

## 2020-07-15 NOTE — Progress Notes (Signed)
Cardiology Office Note:    Date:  07/16/2020   ID:  Arthur Sanchez, DOB [birth year], MRN [id number]  PCP:  Elias Else, MD  Cardiologist:  No primary care provider on file.  Electrophysiologist:  None   Referring MD: Elias Else, MD   Chief Complaint  Patient presents with  . Chest Pain    History of Present Illness:    Arthur Sanchez is a 60 y.o. male with a hx of hyperlipidemia who presents for follow-up.  He was referred by Dr. Katrinka Blazing for evaluation of chest pain, initially seen on 04/13/2020.  He reports that he has had 2 episodes of chest pain.  First episode was 6 weeks ago.  Describes right-sided cramping pain that woke him from sleep.  Reports was 8 out of 10 in intensity.  Lasted for about an hour to an hour and a half.  Reports that about a week later he then had a second episode of what he described as tightness in the right side of his chest.  States that he was sitting at his desk when it occurred.  Lasted for few hours.  States that he exercises 3-4 times per week.  Walks on the treadmill for 15 minutes and does weights.  Denies any exertional chest pain.  Does report he is been having some dyspnea with exertion.  Also reports that he has had chronic issue with ankle swelling, for which he has seen vascular and was told he had venous insufficiency.  He denies any lightheadedness, syncope, or palpitations (reports he had an episode of palpitations years ago for which he wore a monitor and was told there were no abnormalities).  No smoking history.  No history of heart disease in his immediate family.  Echocardiogram on 04/30/2020 showed normal biventricular function, no significant valvular disease.  Coronary CTA on 05/07/2020 showed normal coronary arteries, calcium score 0 (though inferior portion of the heart was not imaged to distal RPDA was not seen).  Since last clinic visit, he reports that he has been doing well.  Denies any chest pain, dyspnea, lightheadedness, syncope, or  palpitations. Mild ankle swelling.     No past medical history on file.  No past surgical history on file.  Current Medications: Current Meds  Medication Sig  . aspirin 81 MG EC tablet Take 81 mg by mouth daily. Swallow whole.  . Loratadine 10 MG CAPS Take by mouth.  . metoprolol tartrate (LOPRESSOR) 25 MG tablet Take 25 mg (1 tablet) TWO hours prior to CT  . Multiple Vitamins-Minerals (MENS MULTIVITAMIN PO) Take by mouth.  . Omega 3 1000 MG CAPS Take by mouth.  . pravastatin (PRAVACHOL) 40 MG tablet 1 TABLET AT BEDTIME BY MOUTH 90 DAYS  . sildenafil (VIAGRA) 50 MG tablet Take 50 mg by mouth as needed for erectile dysfunction.  . TURMERIC PO Take 1,000 mg by mouth daily.  . valACYclovir (VALTREX) 500 MG tablet Take 500 mg by mouth daily.     Allergies:   Dust mite mixed allergen ext  [mite (d. farinae)] and Molds & smuts   Social History   Socioeconomic History  . Marital status: Single    Spouse name: Not on file  . Number of children: Not on file  . Years of education: Not on file  . Highest education level: Not on file  Occupational History  . Not on file  Tobacco Use  . Smoking status: Never Smoker  . Smokeless tobacco: Never Used  Substance and Sexual Activity  .  Alcohol use: Not on file  . Drug use: Not on file  . Sexual activity: Not on file  Other Topics Concern  . Not on file  Social History Narrative  . Not on file   Social Determinants of Health   Financial Resource Strain: Not on file  Food Insecurity: Not on file  Transportation Needs: Not on file  Physical Activity: Not on file  Stress: Not on file  Social Connections: Not on file     Family History: The patient's family history includes Alcohol abuse in his sister; Bipolar disorder in his sister.  ROS:   Please see the history of present illness.     All other systems reviewed and are negative.  EKGs/Labs/Other Studies Reviewed:    The following studies were reviewed today:   EKG:  EKG  is ordered today.  The ekg ordered today demonstrates normal sinus rhythm, rate 56, no ST/T abnormalities  Recent Labs: 04/13/2020: ALT 28; BUN 15; Creatinine, Ser 0.99; Potassium 4.8; Sodium 138  Recent Lipid Panel No results found for: CHOL, TRIG, HDL, CHOLHDL, VLDL, LDLCALC, LDLDIRECT  Physical Exam:    VS:  Ht 6' 3.5" (1.918 m)   Wt 250 lb 9.6 oz (113.7 kg)   SpO2 94%   BMI 30.91 kg/m     Wt Readings from Last 3 Encounters:  07/16/20 250 lb 9.6 oz (113.7 kg)  04/13/20 253 lb (114.8 kg)     GEN:  Well nourished, well developed in no acute distress HEENT: Normal NECK: No JVD; No carotid bruits LYMPHATICS: No lymphadenopathy CARDIAC: RRR, no murmurs, rubs, gallops RESPIRATORY:  Clear to auscultation without rales, wheezing or rhonchi  ABDOMEN: Soft, non-tender, non-distended MUSCULOSKELETAL:  No edema; No deformity  SKIN: Warm and dry NEUROLOGIC:  Alert and oriented x 3 PSYCHIATRIC:  Normal affect   ASSESSMENT:    1. Chest pain of uncertain etiology   2. Lung nodule < 6cm on CT   3. Hyperlipidemia, unspecified hyperlipidemia type    PLAN:    Chest pain: Atypical in description, but does have CAD risk factors (age, hyperlipidemia).   Echocardiogram on 04/30/2020 showed normal biventricular function, no significant valvular disease.  Coronary CTA on 05/07/2020 showed normal coronary arteries, calcium score 0 (though inferior portion of the heart was not imaged to distal RPDA was not seen).  Hyperlipidemia: LDL 119 on 08/12/2019.  On pravastatin 40 mg daily.    Pulmonary nodule: Coronary CTA showed right middle lobe nodule measuring 1.4 x 1.3 x 1.5 cm.  Repeat chest CT in 3 months recommended.  We will follow-up chest CT  RTC as needed  Medication Adjustments/Labs and Tests Ordered: Current medicines are reviewed at length with the patient today.  Concerns regarding medicines are outlined above.  No orders of the defined types were placed in this encounter.  No orders  of the defined types were placed in this encounter.   Patient Instructions  Medication Instructions:  Your physician recommends that you continue on your current medications as directed. Please refer to the Current Medication list given to you today.  Testing/Procedures: CT Chest due End of January  Follow-Up: At [facility], you and your health needs are our priority.  As part of our continuing mission to provide you with exceptional heart care, we have created designated Provider Care Teams.  These Care Teams include your primary Cardiologist (physician) and Advanced Practice Providers (APPs -  Physician Assistants and Nurse Practitioners) who all work together to provide you with the  care you need, when you need it.  We recommend signing up for the patient portal called "MyChart".  Sign up information is provided on this After Visit Summary.  MyChart is used to connect with patients for Virtual Visits (Telemedicine).  Patients are able to view lab/test results, encounter notes, upcoming appointments, etc.  Non-urgent messages can be sent to your provider as well.   To learn more about what you can do with MyChart, go to [email or web address].    Your next appointment:   AS NEEDED with Dr. Gardiner Rhyme      Signed, Donato Heinz, MD  07/16/2020 9:11 AM    [place]

## 2020-07-16 ENCOUNTER — Encounter: Payer: Self-pay | Admitting: Cardiology

## 2020-07-16 ENCOUNTER — Other Ambulatory Visit: Payer: Self-pay

## 2020-07-16 ENCOUNTER — Ambulatory Visit: Payer: Managed Care, Other (non HMO) | Admitting: Cardiology

## 2020-07-16 VITALS — Ht 75.5 in | Wt 250.6 lb

## 2020-07-16 DIAGNOSIS — R079 Chest pain, unspecified: Secondary | ICD-10-CM

## 2020-07-16 DIAGNOSIS — E785 Hyperlipidemia, unspecified: Secondary | ICD-10-CM

## 2020-07-16 DIAGNOSIS — R911 Solitary pulmonary nodule: Secondary | ICD-10-CM

## 2020-07-16 DIAGNOSIS — IMO0001 Reserved for inherently not codable concepts without codable children: Secondary | ICD-10-CM

## 2020-07-16 NOTE — Patient Instructions (Signed)
Medication Instructions:  Your physician recommends that you continue on your current medications as directed. Please refer to the Current Medication list given to you today.  Testing/Procedures: CT Chest due End of January  Follow-Up: At [facility], you and your health needs are our priority.  As part of our continuing mission to provide you with exceptional heart care, we have created designated Provider Care Teams.  These Care Teams include your primary Cardiologist (physician) and Advanced Practice Providers (APPs -  Physician Assistants and Nurse Practitioners) who all work together to provide you with the care you need, when you need it.  We recommend signing up for the patient portal called "MyChart".  Sign up information is provided on this After Visit Summary.  MyChart is used to connect with patients for Virtual Visits (Telemedicine).  Patients are able to view lab/test results, encounter notes, upcoming appointments, etc.  Non-urgent messages can be sent to your provider as well.   To learn more about what you can do with MyChart, go to [email or web address].    Your next appointment:   AS NEEDED with Dr. Bjorn Pippin

## 2020-08-10 ENCOUNTER — Other Ambulatory Visit: Payer: Self-pay

## 2020-08-10 ENCOUNTER — Ambulatory Visit (INDEPENDENT_AMBULATORY_CARE_PROVIDER_SITE_OTHER)
Admission: RE | Admit: 2020-08-10 | Discharge: 2020-08-10 | Disposition: A | Discharge status: Home / Self Care | Payer: Managed Care, Other (non HMO) | Source: Ambulatory Visit | Attending: Cardiology | Admitting: Cardiology

## 2020-08-10 DIAGNOSIS — R911 Solitary pulmonary nodule: Secondary | ICD-10-CM | POA: Diagnosis not present

## 2020-08-10 DIAGNOSIS — IMO0001 Reserved for inherently not codable concepts without codable children: Secondary | ICD-10-CM

## 2020-08-13 ENCOUNTER — Telehealth: Payer: Self-pay | Admitting: *Deleted

## 2020-08-13 NOTE — Telephone Encounter (Signed)
I called patient for 90 day phone for CAD-Fem study. I asked patient to call me back.

## 2021-07-15 DIAGNOSIS — M5416 Radiculopathy, lumbar region: Secondary | ICD-10-CM | POA: Diagnosis not present

## 2021-07-15 DIAGNOSIS — Z01818 Encounter for other preprocedural examination: Secondary | ICD-10-CM | POA: Diagnosis not present

## 2021-07-16 DIAGNOSIS — M5416 Radiculopathy, lumbar region: Secondary | ICD-10-CM | POA: Diagnosis not present

## 2021-07-21 DIAGNOSIS — M48061 Spinal stenosis, lumbar region without neurogenic claudication: Secondary | ICD-10-CM | POA: Diagnosis not present

## 2021-07-21 DIAGNOSIS — M4316 Spondylolisthesis, lumbar region: Secondary | ICD-10-CM | POA: Diagnosis not present

## 2021-07-21 DIAGNOSIS — M5126 Other intervertebral disc displacement, lumbar region: Secondary | ICD-10-CM | POA: Diagnosis not present

## 2021-07-21 DIAGNOSIS — M5416 Radiculopathy, lumbar region: Secondary | ICD-10-CM | POA: Diagnosis not present

## 2021-08-31 ENCOUNTER — Emergency Department (HOSPITAL_COMMUNITY): Payer: BC Managed Care – PPO

## 2021-08-31 ENCOUNTER — Emergency Department (HOSPITAL_COMMUNITY)
Admission: EM | Admit: 2021-08-31 | Discharge: 2021-08-31 | Disposition: A | Discharge status: Home / Self Care | Payer: BC Managed Care – PPO | Attending: Emergency Medicine | Admitting: Emergency Medicine

## 2021-08-31 ENCOUNTER — Encounter (HOSPITAL_COMMUNITY): Payer: Self-pay

## 2021-08-31 ENCOUNTER — Other Ambulatory Visit: Payer: Self-pay

## 2021-08-31 DIAGNOSIS — M5416 Radiculopathy, lumbar region: Secondary | ICD-10-CM | POA: Diagnosis not present

## 2021-08-31 DIAGNOSIS — Z20822 Contact with and (suspected) exposure to covid-19: Secondary | ICD-10-CM | POA: Insufficient documentation

## 2021-08-31 DIAGNOSIS — R55 Syncope and collapse: Secondary | ICD-10-CM | POA: Diagnosis not present

## 2021-08-31 LAB — CBC
HCT: 35.5 % — ABNORMAL LOW (ref 39.0–52.0)
Hemoglobin: 12.5 g/dL — ABNORMAL LOW (ref 13.0–17.0)
MCH: 34 pg (ref 26.0–34.0)
MCHC: 35.2 g/dL (ref 30.0–36.0)
MCV: 96.5 fL (ref 80.0–100.0)
Platelets: 220 10*3/uL (ref 150–400)
RBC: 3.68 MIL/uL — ABNORMAL LOW (ref 4.22–5.81)
RDW: 13 % (ref 11.5–15.5)
WBC: 5.3 10*3/uL (ref 4.0–10.5)
nRBC: 0 % (ref 0.0–0.2)

## 2021-08-31 LAB — TROPONIN I (HIGH SENSITIVITY): Troponin I (High Sensitivity): <2 ng/L (ref <18)

## 2021-08-31 LAB — BASIC METABOLIC PANEL
Anion gap: 6 (ref 5–15)
BUN: 15 mg/dL (ref 6–20)
CO2: 24 mmol/L (ref 22–32)
Calcium: 7.9 mg/dL — ABNORMAL LOW (ref 8.9–10.3)
Chloride: 106 mmol/L (ref 98–111)
Creatinine, Ser: 1.1 mg/dL (ref 0.61–1.24)
GFR, Estimated: >60 mL/min (ref >60)
Glucose, Bld: 133 mg/dL — ABNORMAL HIGH (ref 70–99)
Potassium: 3.8 mmol/L (ref 3.5–5.1)
Sodium: 136 mmol/L (ref 135–145)

## 2021-08-31 LAB — CBG MONITORING, ED: Glucose-Capillary: 137 mg/dL — ABNORMAL HIGH (ref 70–99)

## 2021-08-31 LAB — RESP PANEL BY RT-PCR (FLU A&B, COVID) ARPGX2
Influenza A by PCR: NEGATIVE
Influenza B by PCR: NEGATIVE
SARS Coronavirus 2 by RT PCR: NEGATIVE

## 2021-08-31 LAB — ETHANOL: Alcohol, Ethyl (B): <10 mg/dL (ref <10)

## 2021-08-31 MED ORDER — SODIUM CHLORIDE 0.9 % IV BOLUS
1000.0000 mL | Freq: Once | INTRAVENOUS | Status: AC
  Administered 2021-08-31: 1000 mL via INTRAVENOUS

## 2021-08-31 MED ORDER — ONDANSETRON HCL 4 MG/2ML IJ SOLN
4.0000 mg | Freq: Once | INTRAMUSCULAR | Status: AC
  Administered 2021-08-31: 4 mg via INTRAVENOUS
  Filled 2021-08-31: qty 2

## 2021-08-31 NOTE — ED Notes (Signed)
Pt has had a cup of ice chips so we did axillary. Going to get another temp after fluids.

## 2021-08-31 NOTE — Discharge Instructions (Signed)
Please follow-up with your primary care doctor and your cardiologist to discuss the episode today.  Come back to ER if you have any additional episodes of passing out, any chest pain or difficulty in breathing.

## 2021-08-31 NOTE — ED Triage Notes (Signed)
Pt biba from home. Was sitting at home on a stool having dinner with his wife and had a syncopal episode and was assisted to the floor. Endorses use of pot this evening. No complaints of pain

## 2021-08-31 NOTE — ED Provider Notes (Signed)
[facility] DEPT Provider Note   CSN: [id number] Arrival date & time: 08/31/21  1934     History  Chief Complaint  Patient presents with   Loss of Consciousness    Arthur Sanchez is a 61 y.o. male.  Presents to the emergency department with concern for LOC.  Patient states that he was at home with his wife, having a normal evening, around dinnertime when he had 2 episodes of passing out.  Both episodes were preceded by feeling lightheaded.  Both episodes were brief, wife witnessed the episode, thinks that the second episode was slightly longer and thought he might have had a slight jerk in his arms during the episode but no frank seizure activity.  No bladder or bowel incontinence.  No prolonged confusion state.  No trauma with the episode.  He felt nauseated afterwards but no vomiting.  Currently feels much better.  He denies having any prior episodes of passing out spells.  No chest pain or dyspnea or dyspnea on exertion.  He has seen a cardiologist before for chest pain episodes.  Reviewed note from cardiology January 2022.  Additionally reviewed CT report from coronary CT 05/07/2020, calcium coronary score of 0.  Completely normal coronary CT.  He did endorse using marijuana earlier today but states that he commonly does stress and did not use an unusual amount.  This does not combine with any other substances.  Denies any other substance use or abuse.  HPI     Home Medications Prior to Admission medications   Medication Sig Start Date End Date Taking? Authorizing Provider  cholecalciferol (VITAMIN D) 25 MCG (1000 UNIT) tablet Take 1,000 Units by mouth daily.   Yes [provider]  Loratadine 10 MG CAPS Take 10 mg by mouth daily.   Yes [provider]  Multiple Vitamins-Minerals (MENS MULTIVITAMIN PO) Take 1 tablet by mouth daily.   Yes [provider]  Omega 3 1000 MG CAPS Take 2,000 mg by mouth daily.   Yes [provider]  pravastatin (PRAVACHOL) 40 MG tablet Take 40 mg by mouth at bedtime. 09/20/18  Yes [provider]  sildenafil (VIAGRA) 50 MG tablet Take 50 mg by mouth as needed for erectile dysfunction.   Yes [provider]  TURMERIC PO Take 1,000 mg by mouth 2 (two) times daily.   Yes [provider]  valACYclovir (VALTREX) 500 MG tablet Take 500 mg by mouth daily. 11/26/18  Yes [provider]      Allergies    Dust mite mixed allergen ext  [mite (d. farinae)] and Molds & smuts    Review of Systems   Review of Systems  Constitutional:  Negative for chills and fever.  HENT:  Negative for ear pain and sore throat.   Eyes:  Negative for pain and visual disturbance.  Respiratory:  Negative for cough and shortness of breath.   Cardiovascular:  Negative for chest pain and palpitations.  Gastrointestinal:  Negative for abdominal pain and vomiting.  Genitourinary:  Negative for dysuria and hematuria.  Musculoskeletal:  Negative for arthralgias and back pain.  Skin:  Negative for color change and rash.  Neurological:  Positive for syncope. Negative for seizures.  All other systems reviewed and are negative.  Physical Exam Updated Vital Signs BP 124/71    Pulse 81    Temp (!) 96.3 F (35.7 C) (Axillary)    Resp 16    SpO2 96%  Physical Exam Vitals and nursing note  reviewed.  Constitutional:      General: He is not in acute distress.    Appearance: He is well-developed.  HENT:     Head: Normocephalic and atraumatic.  Eyes:     Conjunctiva/sclera: Conjunctivae normal.  Cardiovascular:     Rate and Rhythm: Normal rate and regular rhythm.     Heart sounds: No murmur heard. Pulmonary:     Effort: Pulmonary effort is normal. No respiratory distress.     Breath sounds: Normal breath sounds.  Abdominal:     Palpations: Abdomen is soft.     Tenderness: There is no abdominal tenderness.  Musculoskeletal:        General: No swelling.     Cervical  back: Neck supple.  Skin:    General: Skin is warm and dry.     Capillary Refill: Capillary refill takes less than 2 seconds.  Neurological:     Mental Status: He is alert.  Psychiatric:        Mood and Affect: Mood normal.    ED Results / Procedures / Treatments   Labs (all labs ordered are listed, but only abnormal results are displayed) Labs Reviewed  BASIC METABOLIC PANEL - Abnormal; Notable for the following components:      Result Value   Glucose, Bld 133 (*)    Calcium 7.9 (*)    All other components within normal limits  CBC - Abnormal; Notable for the following components:   RBC 3.68 (*)    Hemoglobin 12.5 (*)    HCT 35.5 (*)    All other components within normal limits  CBG MONITORING, ED - Abnormal; Notable for the following components:   Glucose-Capillary 137 (*)    All other components within normal limits  RESP PANEL BY RT-PCR (FLU A&B, COVID) ARPGX2  ETHANOL  TROPONIN I (HIGH SENSITIVITY)  TROPONIN I (HIGH SENSITIVITY)    EKG EKG Interpretation  Date/Time:  Tuesday August 31 2021 19:57:45 EST Ventricular Rate:  58 PR Interval:  170 QRS Duration: 103 QT Interval:  445 QTC Calculation: 438 R Axis:   2 Text Interpretation: Sinus rhythm Low voltage, precordial leads Anteroseptal infarct, old Confirmed by Madalyn Rob [phone]) on 08/31/2021 8:18:34 PM  Radiology DG Chest 2 View  Result Date: 08/31/2021 CLINICAL DATA:  syncope EXAM: CHEST - 2 VIEW.  Patient is slightly rotated on frontal view. COMPARISON:  Chest x-ray 12/30/2006 FINDINGS: Slightly more prominent cardiac silhouette likely due to AP technique and low lung volumes. Otherwise the heart and mediastinal contours are within normal limits. Slightly low lung volumes. No focal consolidation. No pulmonary edema. Nonspecific blunting of left costophrenic angle on frontal view with possible trace pleural effusion. No right pleural effusion. No pneumothorax. No acute osseous abnormality. IMPRESSION: 1.  Slightly low lung volumes. 2. Possible trace left pleural effusion. Electronically Signed   By: Iven Finn M.D.   On: 08/31/2021 23:19   CT Head Wo Contrast  Result Date: 08/31/2021 CLINICAL DATA:  Syncopal episode. EXAM: CT HEAD WITHOUT CONTRAST TECHNIQUE: Contiguous axial images were obtained from the base of the skull through the vertex without intravenous contrast. RADIATION DOSE REDUCTION: This exam was performed according to the departmental dose-optimization program which includes automated exposure control, adjustment of the mA and/or kV according to patient size and/or use of iterative reconstruction technique. COMPARISON:  None. FINDINGS: Brain: No evidence of acute infarction, hemorrhage, hydrocephalus, extra-axial collection or mass lesion/mass effect. Vascular: No hyperdense vessel or unexpected calcification. Skull: Normal. Negative for fracture or  focal lesion. Sinuses/Orbits: No acute finding. Other: None. IMPRESSION: No acute intracranial pathology. Electronically Signed   By: Virgina Norfolk M.D.   On: 08/31/2021 21:19    Procedures Procedures    Medications Ordered in ED Medications  ondansetron [facility]) injection 4 mg (4 mg Intravenous Given 08/31/21 2116)  sodium chloride 0.9 % bolus 1,000 mL (0 mLs Intravenous Stopped 08/31/21 2341)    ED Course/ Medical Decision Making/ A&P                           Medical Decision Making Amount and/or Complexity of Data Reviewed Labs: ordered. Radiology: ordered.  Risk Prescription drug management.   61 year old male presenting to the emergency department with concern for syncopal episode.  On physical exam he was well-appearing in no acute distress, vital signs were within normal limits.  Basic lab work was obtained and reviewed, hemoglobin 12.5, no significant anemia, no electrolyte derangement or change in kidney function.  Glucose within normal limits.  COVID and flu testing negative.  EKG without acute ischemic change,  troponin undetectable, coronary CT in 2021 very low risk.  Doubt ACS.  Echocardiogram in 2021 grossly normal.  Provided patient's with some fluids and antiemetic.  He had initially endorsed slight nausea but this completely resolved.  He was observed on a cardiac monitor, remained in sinus rhythm, no cardiac arrhythmias were appreciated.  Due to the wife's report of possible arm twitching, check CT head, this was negative.  Suspect most likely myoclonic jerks and less likely seizure activity.  Given the reassuring work-up and patient's reassuring clinical appearance and his lack of any major medical comorbidities, feel that he can be further worked up and managed on an outpatient basis regarding the episode from tonight.  Advised follow-up both with cardiology and primary care.  Reviewed return precautions and discharged home.    After the discussed management above, the patient was determined to be safe for discharge.  The patient was in agreement with this plan and all questions regarding their care were answered.  ED return precautions were discussed and the patient will return to the ED with any significant worsening of condition.         Final Clinical Impression(s) / ED Diagnoses Final diagnoses:  Syncope, unspecified syncope type    Rx / DC Orders ED Discharge Orders     None         Lucrezia Starch, MD 09/01/21 1511

## 2021-09-01 DIAGNOSIS — R9389 Abnormal findings on diagnostic imaging of other specified body structures: Secondary | ICD-10-CM | POA: Diagnosis not present

## 2021-09-01 DIAGNOSIS — J029 Acute pharyngitis, unspecified: Secondary | ICD-10-CM | POA: Diagnosis not present

## 2021-09-01 DIAGNOSIS — B349 Viral infection, unspecified: Secondary | ICD-10-CM | POA: Diagnosis not present

## 2021-09-01 DIAGNOSIS — R051 Acute cough: Secondary | ICD-10-CM | POA: Diagnosis not present

## 2021-09-01 DIAGNOSIS — R519 Headache, unspecified: Secondary | ICD-10-CM | POA: Diagnosis not present

## 2021-09-10 ENCOUNTER — Ambulatory Visit
Admission: RE | Admit: 2021-09-10 | Discharge: 2021-09-10 | Disposition: A | Discharge status: Home / Self Care | Payer: BC Managed Care – PPO | Source: Ambulatory Visit | Attending: Family Medicine | Admitting: Family Medicine

## 2021-09-10 ENCOUNTER — Other Ambulatory Visit: Payer: Self-pay

## 2021-09-10 ENCOUNTER — Other Ambulatory Visit: Payer: Self-pay | Admitting: Family Medicine

## 2021-09-10 DIAGNOSIS — J90 Pleural effusion, not elsewhere classified: Secondary | ICD-10-CM

## 2021-09-10 DIAGNOSIS — R059 Cough, unspecified: Secondary | ICD-10-CM | POA: Diagnosis not present

## 2021-09-13 DIAGNOSIS — R059 Cough, unspecified: Secondary | ICD-10-CM | POA: Diagnosis not present

## 2021-09-13 DIAGNOSIS — J3089 Other allergic rhinitis: Secondary | ICD-10-CM | POA: Diagnosis not present

## 2021-11-23 DIAGNOSIS — D124 Benign neoplasm of descending colon: Secondary | ICD-10-CM | POA: Diagnosis not present

## 2021-11-23 DIAGNOSIS — K648 Other hemorrhoids: Secondary | ICD-10-CM | POA: Diagnosis not present

## 2021-11-23 DIAGNOSIS — D123 Benign neoplasm of transverse colon: Secondary | ICD-10-CM | POA: Diagnosis not present

## 2021-11-23 DIAGNOSIS — K644 Residual hemorrhoidal skin tags: Secondary | ICD-10-CM | POA: Diagnosis not present

## 2021-11-23 DIAGNOSIS — Z86010 Personal history of colonic polyps: Secondary | ICD-10-CM | POA: Diagnosis not present

## 2021-11-23 DIAGNOSIS — Z09 Encounter for follow-up examination after completed treatment for conditions other than malignant neoplasm: Secondary | ICD-10-CM | POA: Diagnosis not present

## 2021-11-23 DIAGNOSIS — K5730 Diverticulosis of large intestine without perforation or abscess without bleeding: Secondary | ICD-10-CM | POA: Diagnosis not present

## 2022-01-07 DIAGNOSIS — R059 Cough, unspecified: Secondary | ICD-10-CM | POA: Diagnosis not present

## 2022-01-07 DIAGNOSIS — J300 Vasomotor rhinitis: Secondary | ICD-10-CM | POA: Diagnosis not present

## 2022-02-01 DIAGNOSIS — L821 Other seborrheic keratosis: Secondary | ICD-10-CM | POA: Diagnosis not present

## 2022-02-01 DIAGNOSIS — L570 Actinic keratosis: Secondary | ICD-10-CM | POA: Diagnosis not present

## 2022-02-01 DIAGNOSIS — L578 Other skin changes due to chronic exposure to nonionizing radiation: Secondary | ICD-10-CM | POA: Diagnosis not present

## 2022-02-01 DIAGNOSIS — L814 Other melanin hyperpigmentation: Secondary | ICD-10-CM | POA: Diagnosis not present

## 2022-02-01 DIAGNOSIS — D225 Melanocytic nevi of trunk: Secondary | ICD-10-CM | POA: Diagnosis not present

## 2022-05-19 IMAGING — CR DG CHEST 2V
2 series · 2 of 2 positions shown · non-contrast
Comparison: Chest x-ray 12/30/2006

CLINICAL DATA: syncope

EXAM:
CHEST - 2 VIEW.  Patient is slightly rotated on frontal view.

[w chest lat]
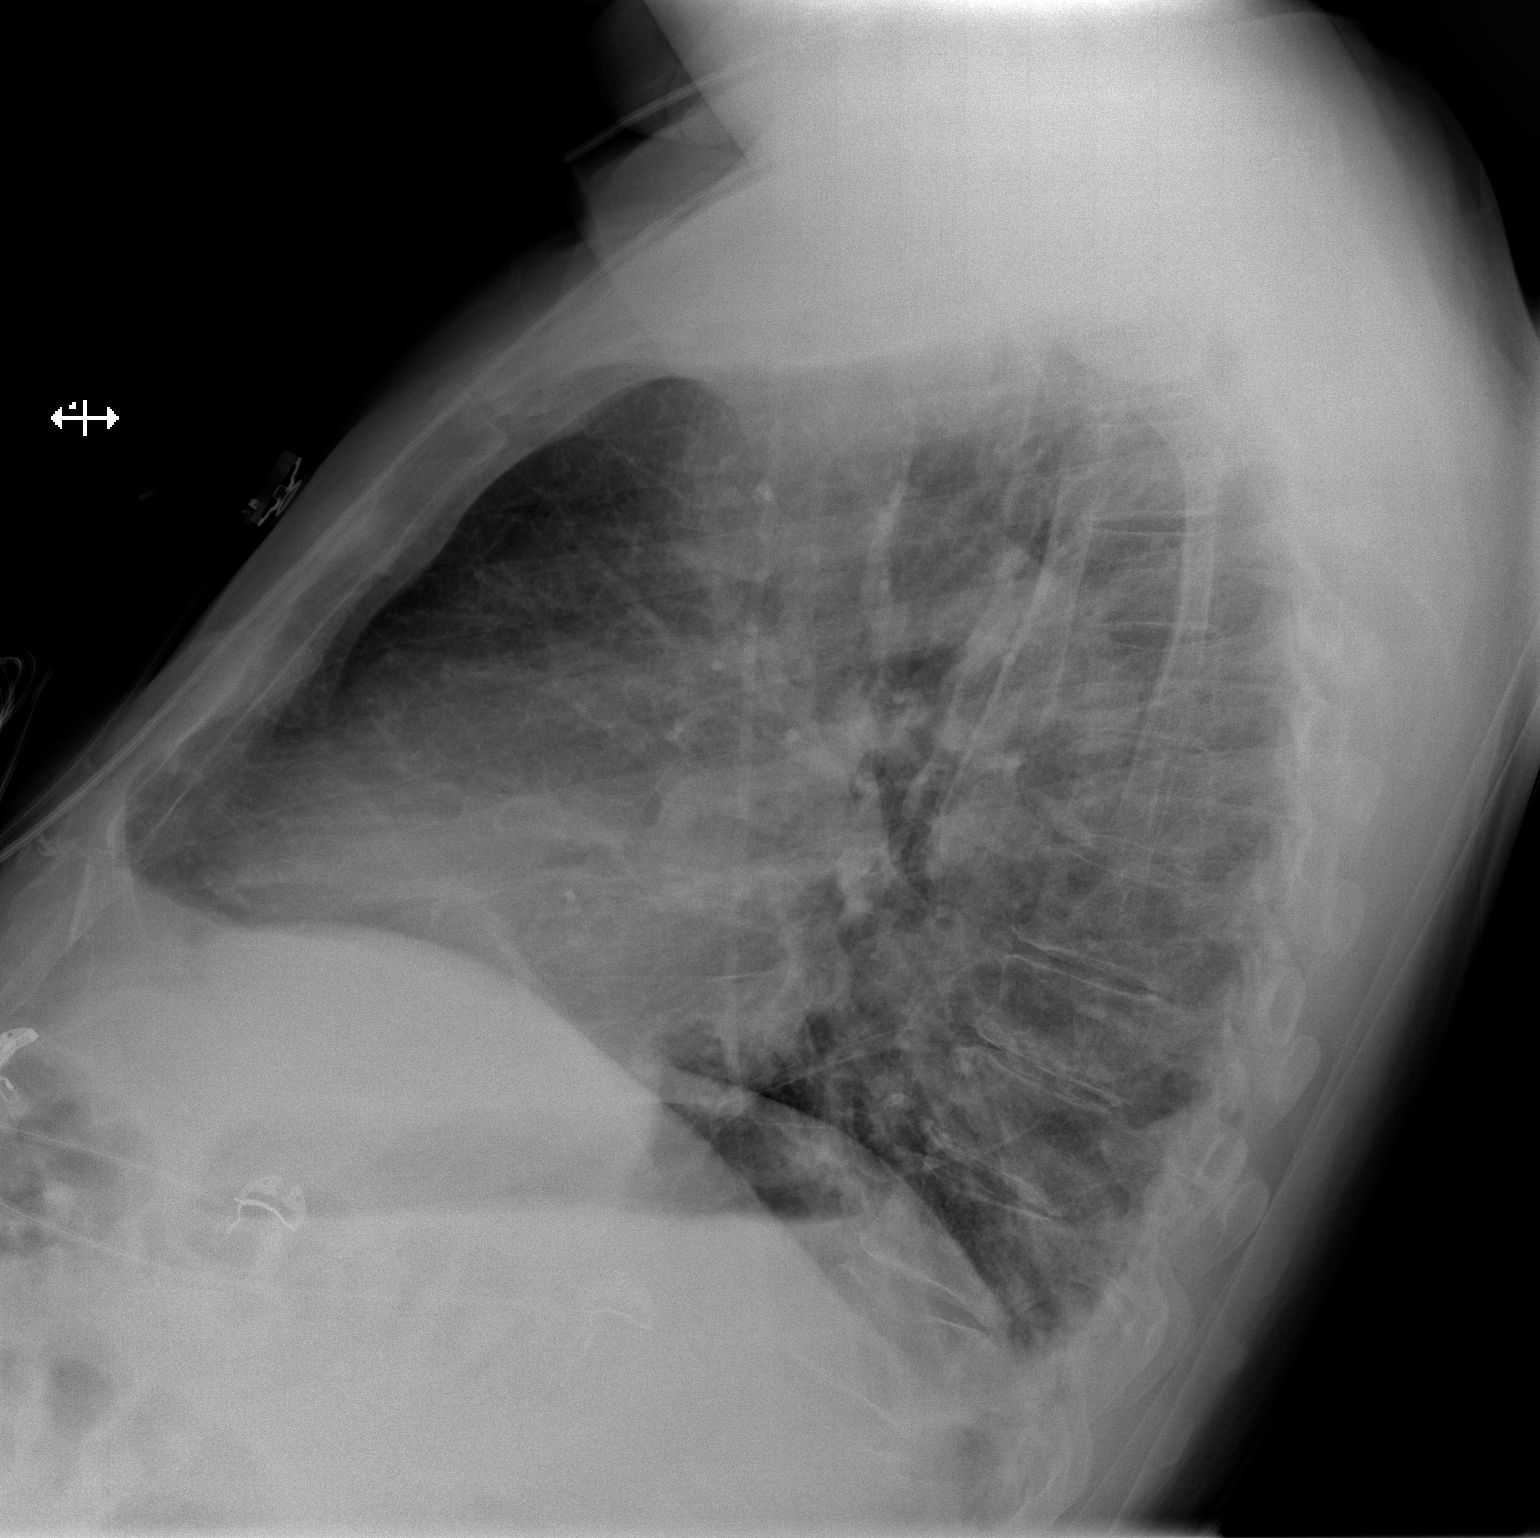

[x chest ap]
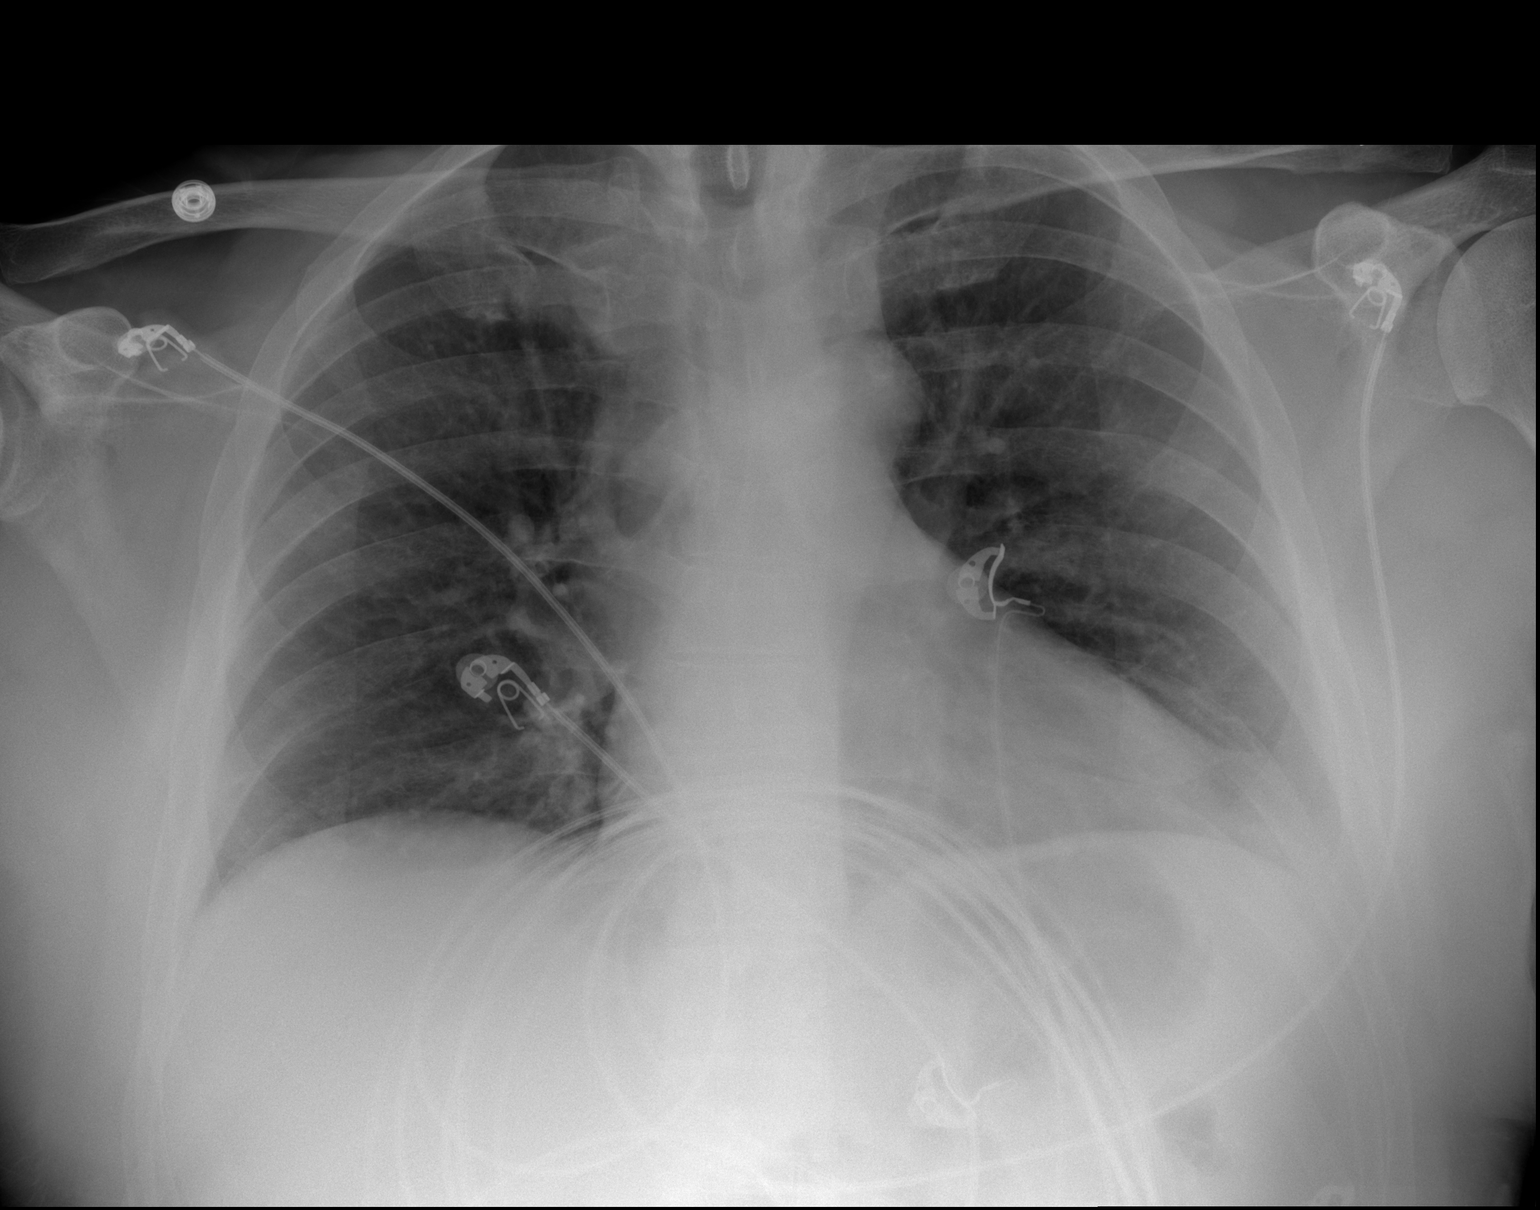

[2 of 2 positions shown; findings below may reference images not displayed]

FINDINGS: Slightly more prominent cardiac silhouette likely due to AP
technique and low lung volumes. Otherwise the heart and mediastinal
contours are within normal limits.

Slightly low lung volumes. No focal consolidation. No pulmonary
edema. Nonspecific blunting of left costophrenic angle on frontal
view with possible trace pleural effusion. No right pleural
effusion. No pneumothorax.

No acute osseous abnormality.
IMPRESSION: 1. Slightly low lung volumes.
2. Possible trace left pleural effusion.

## 2022-05-19 IMAGING — CT CT HEAD W/O CM
3 series · 14 of 47 positions shown, 16 images · non-contrast
Comparison: None.

CLINICAL DATA: Syncopal episode.



[Series 2: head wo · axial · 0.47mm/px · z∈[-165,-35]mm · 8 of 32 slices shown, 10 images]
[im 3/32  brain]
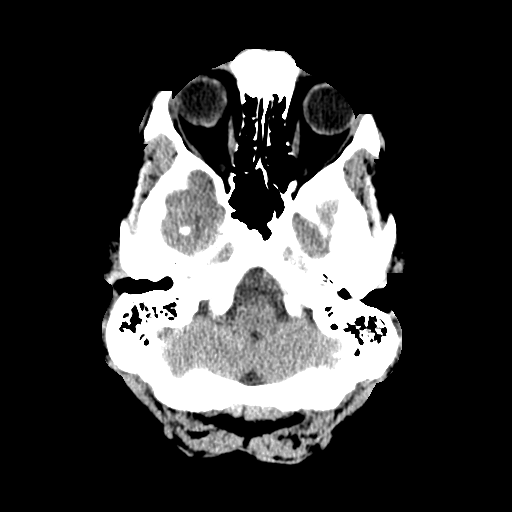
[im 3/32  bone]
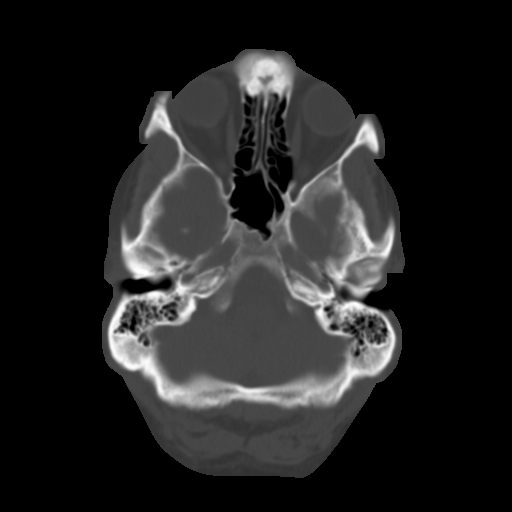
[im 7/32  brain]
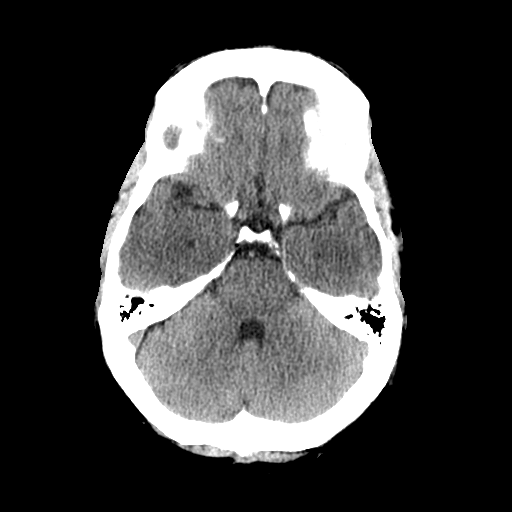
[im 10/32  brain]
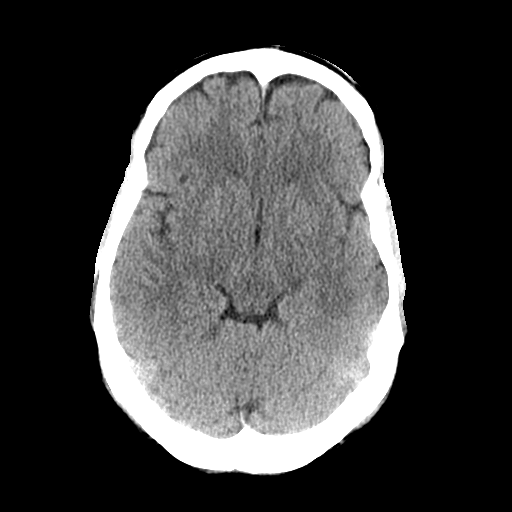
[im 14/32  brain]
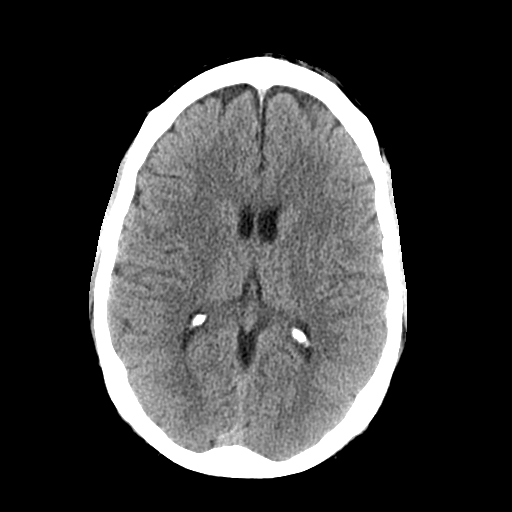
[im 18/32  brain]
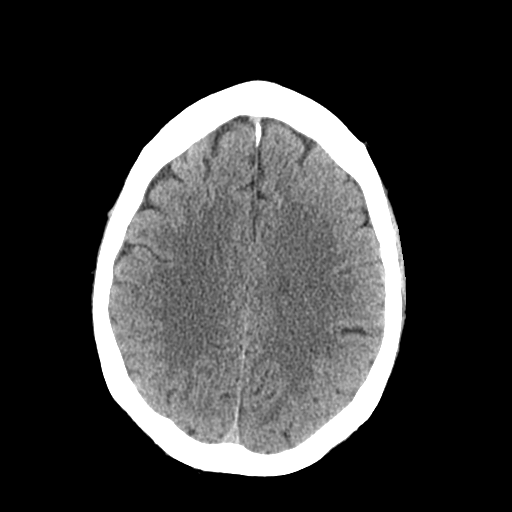
[im 18/32  bone]
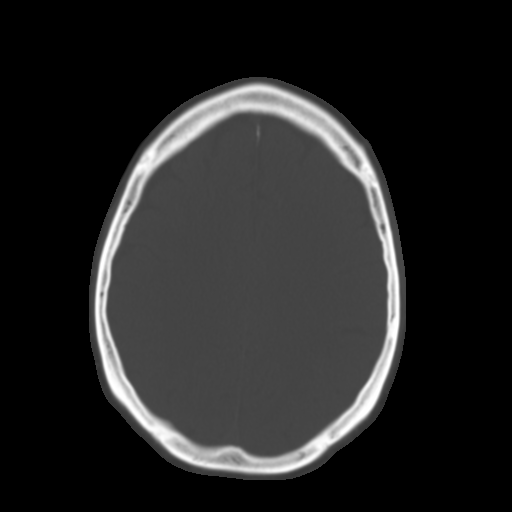
[im 22/32  brain]
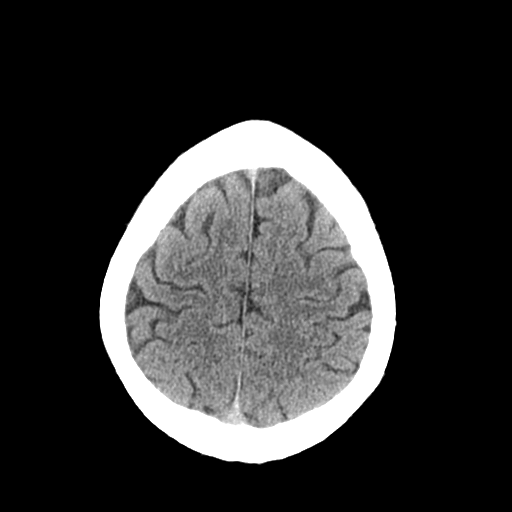
[im 25/32  brain]
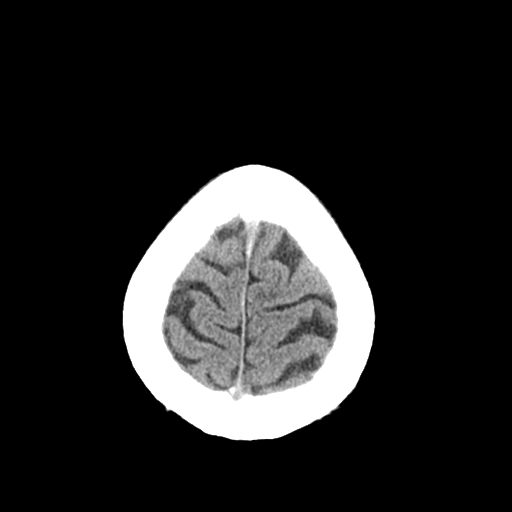
[im 29/32  brain]
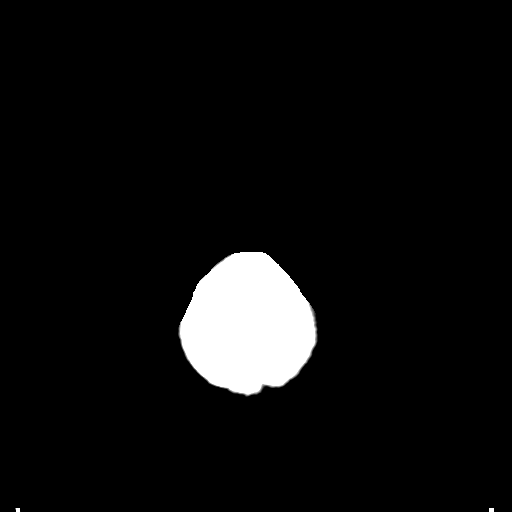

[Series 4: coronal soft tissue · coronal · 0.31mm/px · 3 of 84 slices shown]
[im 30/84  brain]
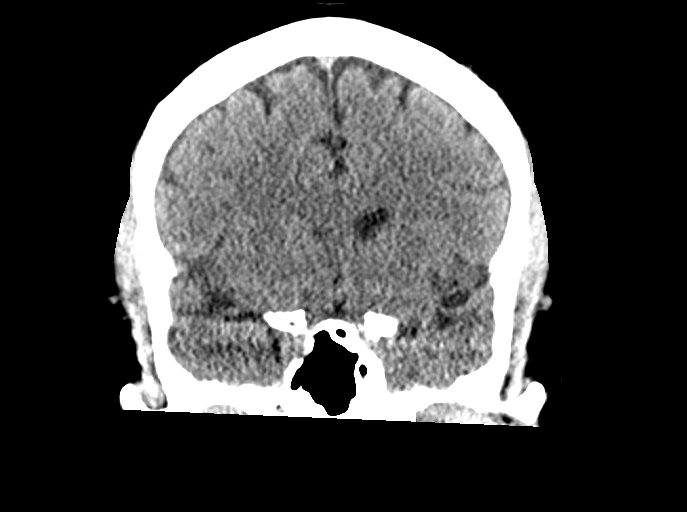
[im 38/84  brain]
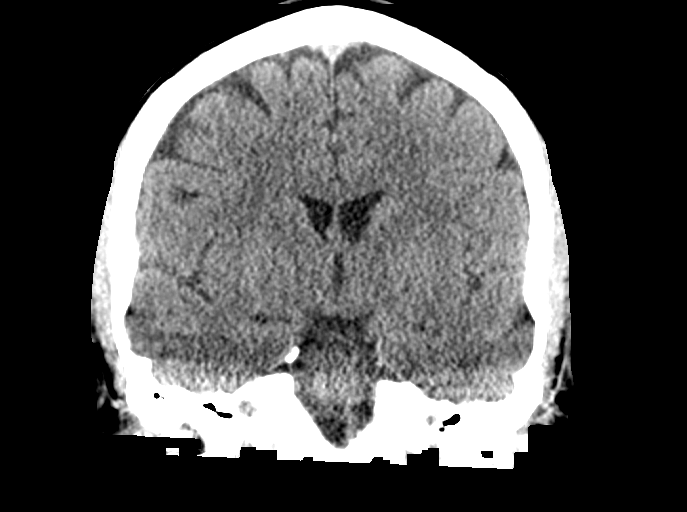
[im 46/84  brain]
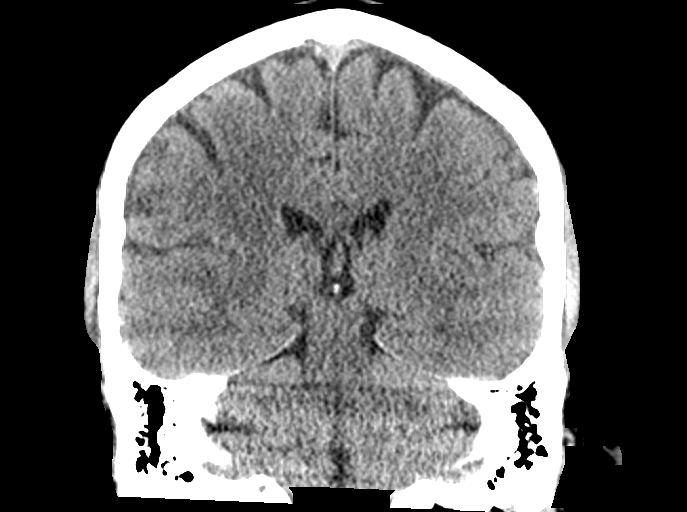

[Series 5: sagittal soft tissue · sagittal · 0.31mm/px · 3 of 71 slices shown]
[im 24/71  brain]
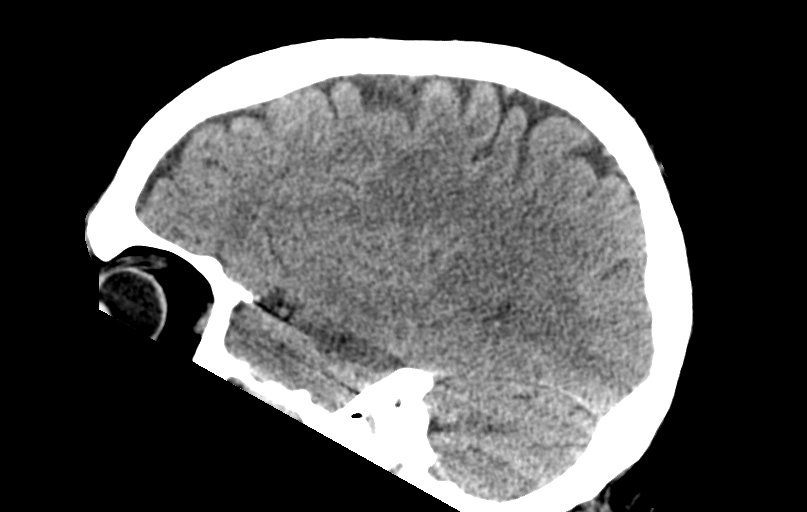
[im 36/71  brain]
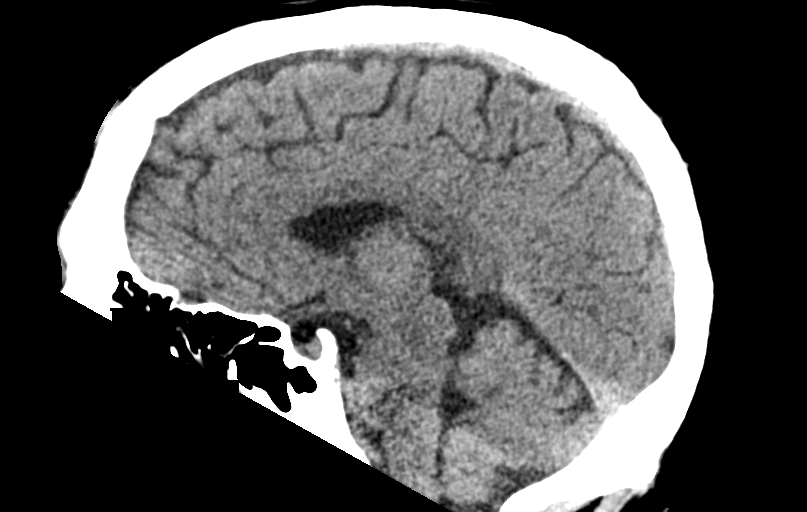
[im 47/71  brain]
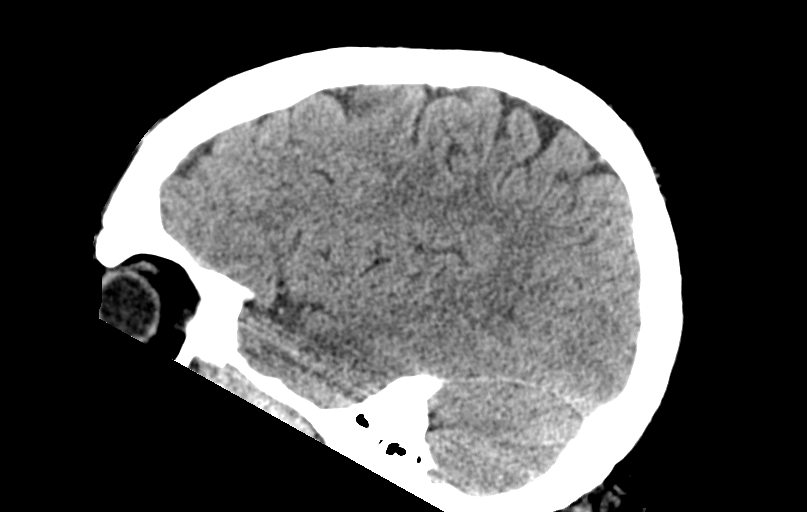

[14 of 47 positions shown; findings below may reference images not displayed]

FINDINGS: Brain: No evidence of acute infarction, hemorrhage, hydrocephalus,
extra-axial collection or mass lesion/mass effect.

Vascular: No hyperdense vessel or unexpected calcification.

Skull: Normal. Negative for fracture or focal lesion.

Sinuses/Orbits: No acute finding.

Other: None.
IMPRESSION: No acute intracranial pathology.

## 2022-05-29 IMAGING — CR DG CHEST 2V
2 series · 2 of 2 positions shown · non-contrast
Comparison: 08/31/2021

CLINICAL DATA: Cough and congestion for 1 week, history of prior
left effusion, initial encounter

EXAM:
CHEST - 2 VIEW

[w chest pa]
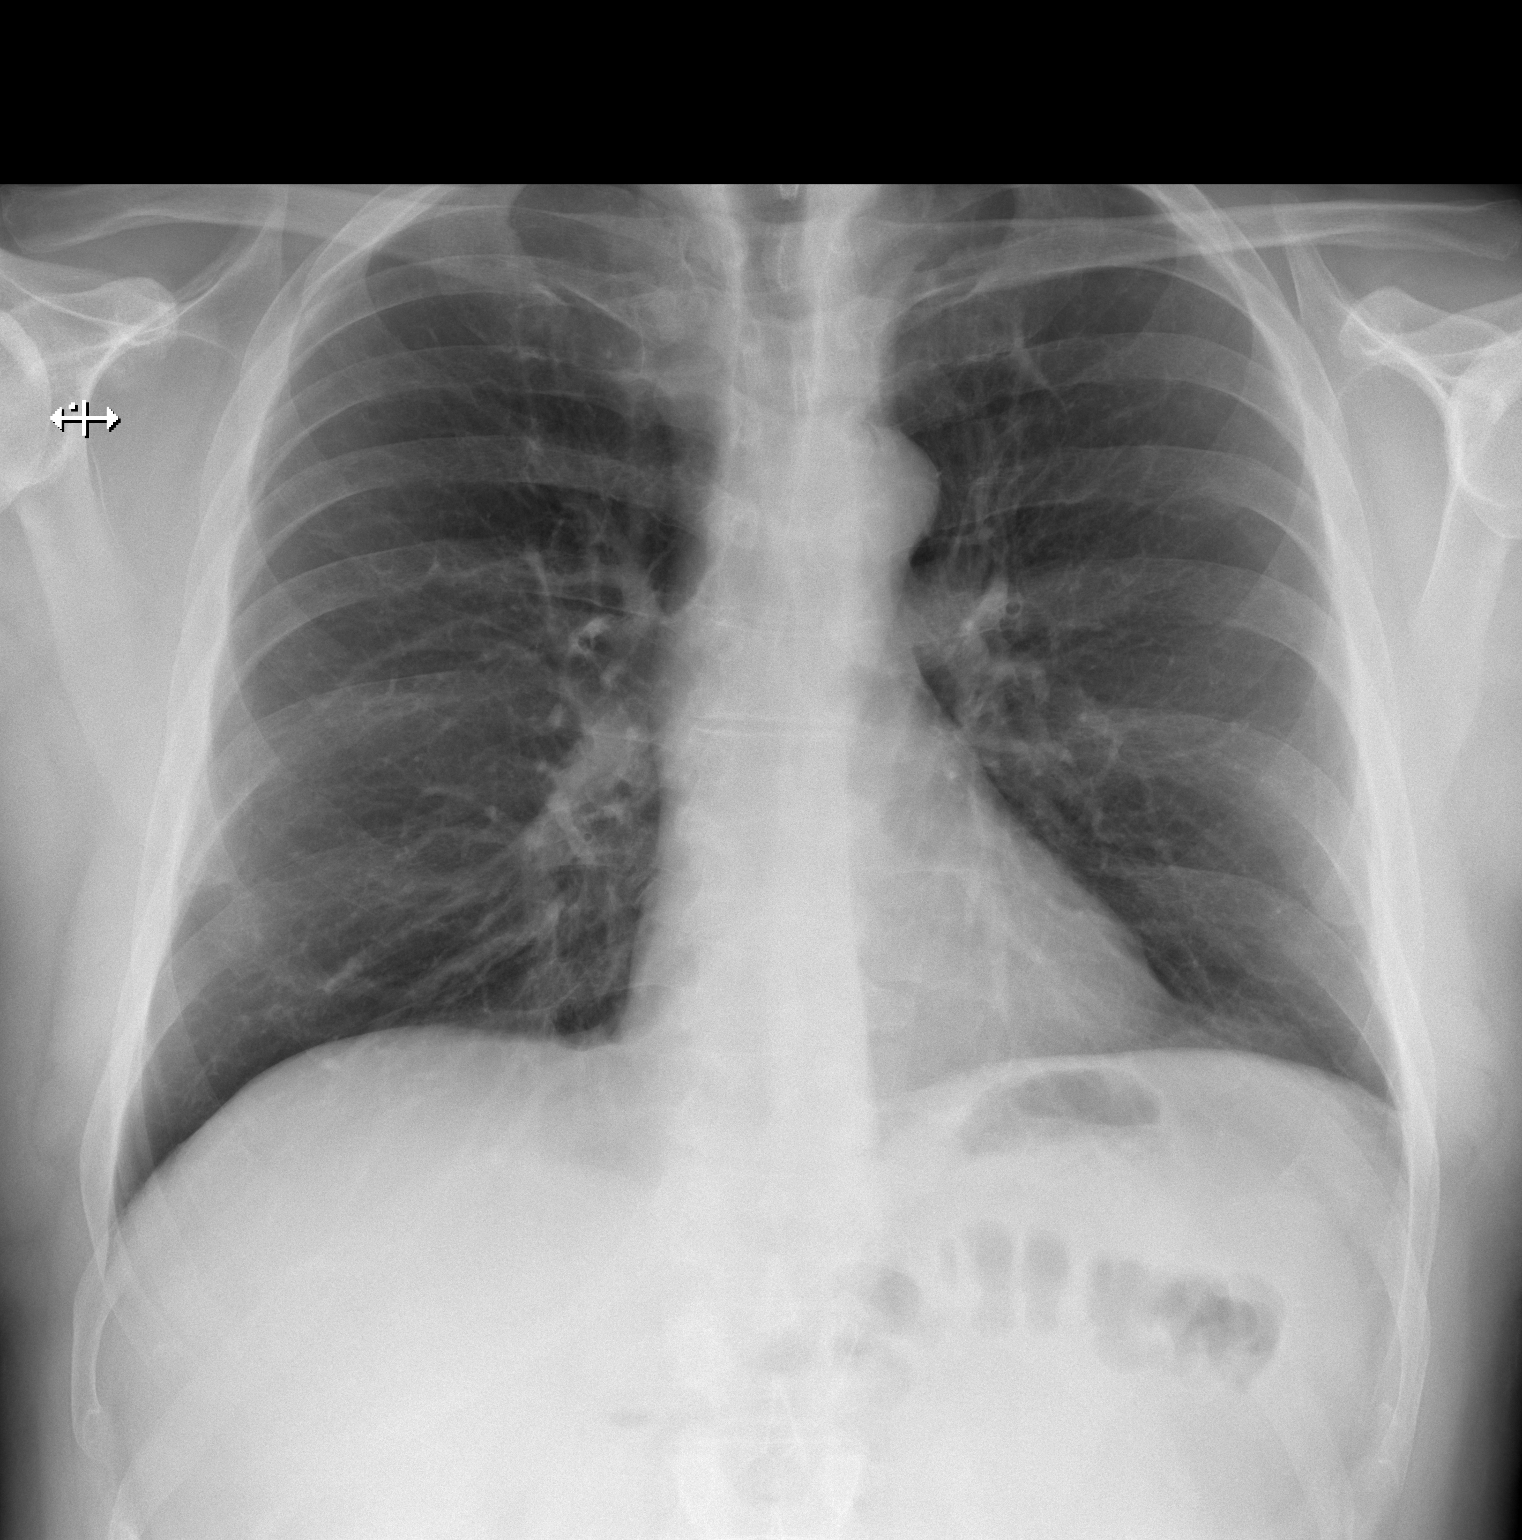

[w chest lat]
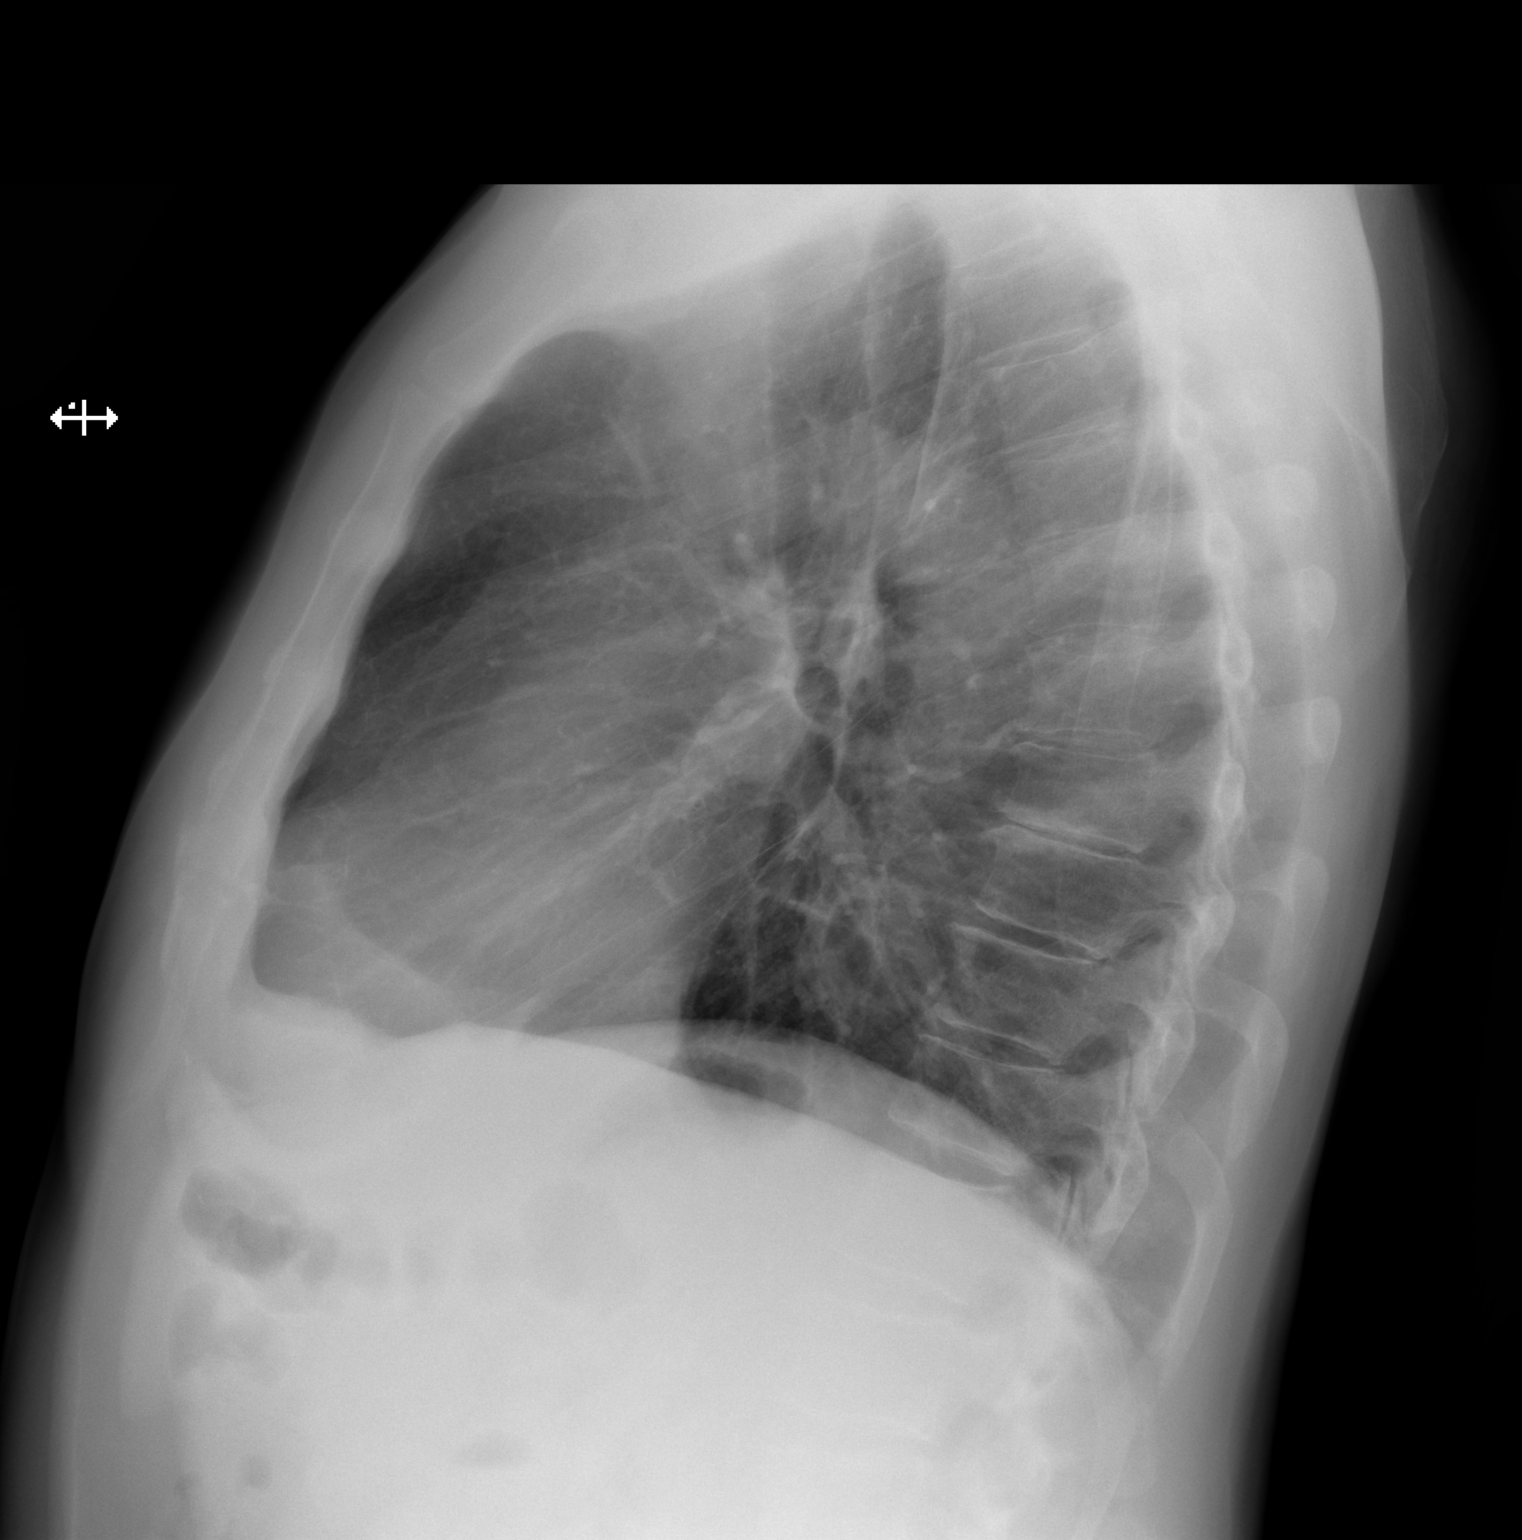

[2 of 2 positions shown; findings below may reference images not displayed]

FINDINGS: Cardiac shadow is within normal limits. Lungs are well aerated
bilaterally. No focal infiltrate or effusion is seen. No acute bony
abnormality is noted.
IMPRESSION: No acute abnormality noted.

## 2022-06-22 DIAGNOSIS — M545 Low back pain, unspecified: Secondary | ICD-10-CM | POA: Diagnosis not present

## 2022-06-22 DIAGNOSIS — J302 Other seasonal allergic rhinitis: Secondary | ICD-10-CM | POA: Diagnosis not present

## 2022-06-22 DIAGNOSIS — E7800 Pure hypercholesterolemia, unspecified: Secondary | ICD-10-CM | POA: Diagnosis not present

## 2022-06-22 DIAGNOSIS — N529 Male erectile dysfunction, unspecified: Secondary | ICD-10-CM | POA: Diagnosis not present

## 2022-06-22 DIAGNOSIS — Z0000 Encounter for general adult medical examination without abnormal findings: Secondary | ICD-10-CM | POA: Diagnosis not present

## 2022-06-22 DIAGNOSIS — Z125 Encounter for screening for malignant neoplasm of prostate: Secondary | ICD-10-CM | POA: Diagnosis not present

## 2023-01-20 DIAGNOSIS — J300 Vasomotor rhinitis: Secondary | ICD-10-CM | POA: Diagnosis not present

## 2023-01-20 DIAGNOSIS — R052 Subacute cough: Secondary | ICD-10-CM | POA: Diagnosis not present

## 2023-02-07 DIAGNOSIS — L578 Other skin changes due to chronic exposure to nonionizing radiation: Secondary | ICD-10-CM | POA: Diagnosis not present

## 2023-02-07 DIAGNOSIS — D2372 Other benign neoplasm of skin of left lower limb, including hip: Secondary | ICD-10-CM | POA: Diagnosis not present

## 2023-02-07 DIAGNOSIS — L814 Other melanin hyperpigmentation: Secondary | ICD-10-CM | POA: Diagnosis not present

## 2023-02-07 DIAGNOSIS — D225 Melanocytic nevi of trunk: Secondary | ICD-10-CM | POA: Diagnosis not present

## 2023-02-07 DIAGNOSIS — D485 Neoplasm of uncertain behavior of skin: Secondary | ICD-10-CM | POA: Diagnosis not present

## 2023-04-19 DIAGNOSIS — M1712 Unilateral primary osteoarthritis, left knee: Secondary | ICD-10-CM | POA: Diagnosis not present

## 2023-04-19 DIAGNOSIS — M25562 Pain in left knee: Secondary | ICD-10-CM | POA: Diagnosis not present

## 2023-06-19 DIAGNOSIS — M1712 Unilateral primary osteoarthritis, left knee: Secondary | ICD-10-CM | POA: Diagnosis not present

## 2023-06-19 DIAGNOSIS — M25662 Stiffness of left knee, not elsewhere classified: Secondary | ICD-10-CM | POA: Diagnosis not present

## 2023-06-19 DIAGNOSIS — M25562 Pain in left knee: Secondary | ICD-10-CM | POA: Diagnosis not present

## 2023-06-20 DIAGNOSIS — J302 Other seasonal allergic rhinitis: Secondary | ICD-10-CM | POA: Diagnosis not present

## 2023-06-20 DIAGNOSIS — E7800 Pure hypercholesterolemia, unspecified: Secondary | ICD-10-CM | POA: Diagnosis not present

## 2023-06-20 DIAGNOSIS — Z01818 Encounter for other preprocedural examination: Secondary | ICD-10-CM | POA: Diagnosis not present

## 2023-06-20 DIAGNOSIS — M1712 Unilateral primary osteoarthritis, left knee: Secondary | ICD-10-CM | POA: Diagnosis not present

## 2023-06-20 DIAGNOSIS — Z23 Encounter for immunization: Secondary | ICD-10-CM | POA: Diagnosis not present

## 2023-09-15 DIAGNOSIS — N529 Male erectile dysfunction, unspecified: Secondary | ICD-10-CM | POA: Diagnosis not present

## 2023-09-15 DIAGNOSIS — E7800 Pure hypercholesterolemia, unspecified: Secondary | ICD-10-CM | POA: Diagnosis not present

## 2023-09-15 DIAGNOSIS — Z0000 Encounter for general adult medical examination without abnormal findings: Secondary | ICD-10-CM | POA: Diagnosis not present

## 2023-09-15 DIAGNOSIS — J302 Other seasonal allergic rhinitis: Secondary | ICD-10-CM | POA: Diagnosis not present

## 2023-09-15 DIAGNOSIS — Z125 Encounter for screening for malignant neoplasm of prostate: Secondary | ICD-10-CM | POA: Diagnosis not present

## 2023-09-15 DIAGNOSIS — A6002 Herpesviral infection of other male genital organs: Secondary | ICD-10-CM | POA: Diagnosis not present

## 2024-01-23 DIAGNOSIS — R052 Subacute cough: Secondary | ICD-10-CM | POA: Diagnosis not present

## 2024-01-23 DIAGNOSIS — J300 Vasomotor rhinitis: Secondary | ICD-10-CM | POA: Diagnosis not present

## 2024-03-14 DIAGNOSIS — M79671 Pain in right foot: Secondary | ICD-10-CM | POA: Diagnosis not present

## 2024-03-15 DIAGNOSIS — M79671 Pain in right foot: Secondary | ICD-10-CM | POA: Diagnosis not present

## 2024-04-09 DIAGNOSIS — M19071 Primary osteoarthritis, right ankle and foot: Secondary | ICD-10-CM | POA: Diagnosis not present
